# Patient Record
Sex: Male | Born: 1955 | Race: Black or African American | Hispanic: No | Marital: Married | Smoking: Former smoker
Health system: Southern US, Community
[De-identification: ages and names within clinical notes are randomized; demographics above are authoritative.]

## PROBLEM LIST (undated history)

## (undated) DIAGNOSIS — H538 Other visual disturbances: Secondary | ICD-10-CM

## (undated) DIAGNOSIS — F32A Depression, unspecified: Secondary | ICD-10-CM

## (undated) DIAGNOSIS — Z973 Presence of spectacles and contact lenses: Secondary | ICD-10-CM

## (undated) DIAGNOSIS — I10 Essential (primary) hypertension: Secondary | ICD-10-CM

## (undated) DIAGNOSIS — N400 Enlarged prostate without lower urinary tract symptoms: Secondary | ICD-10-CM

## (undated) DIAGNOSIS — R7303 Prediabetes: Secondary | ICD-10-CM

## (undated) DIAGNOSIS — K4090 Unilateral inguinal hernia, without obstruction or gangrene, not specified as recurrent: Secondary | ICD-10-CM

## (undated) DIAGNOSIS — S83207A Unspecified tear of unspecified meniscus, current injury, left knee, initial encounter: Secondary | ICD-10-CM

## (undated) DIAGNOSIS — C61 Malignant neoplasm of prostate: Secondary | ICD-10-CM

## (undated) DIAGNOSIS — M1990 Unspecified osteoarthritis, unspecified site: Secondary | ICD-10-CM

## (undated) DIAGNOSIS — R972 Elevated prostate specific antigen [PSA]: Secondary | ICD-10-CM

## (undated) DIAGNOSIS — K219 Gastro-esophageal reflux disease without esophagitis: Secondary | ICD-10-CM

## (undated) DIAGNOSIS — M549 Dorsalgia, unspecified: Secondary | ICD-10-CM

## (undated) DIAGNOSIS — E7800 Pure hypercholesterolemia, unspecified: Secondary | ICD-10-CM

## (undated) HISTORY — DX: Dorsalgia, unspecified: M54.9

## (undated) HISTORY — DX: Essential (primary) hypertension: I10

## (undated) HISTORY — DX: Other visual disturbances: H53.8

## (undated) HISTORY — DX: Unilateral inguinal hernia, without obstruction or gangrene, not specified as recurrent: K40.90

## (undated) HISTORY — PX: PROSTATE BIOPSY: SHX241

## (undated) HISTORY — PX: HERNIA REPAIR: SHX51

## (undated) HISTORY — PX: INGUINAL HERNIA REPAIR: SUR1180

---

## 1997-12-25 ENCOUNTER — Emergency Department (HOSPITAL_COMMUNITY): Admission: EM | Admit: 1997-12-25 | Discharge: 1997-12-25 | Payer: Self-pay | Admitting: Emergency Medicine

## 1998-01-06 ENCOUNTER — Emergency Department (HOSPITAL_COMMUNITY): Admission: EM | Admit: 1998-01-06 | Discharge: 1998-01-06 | Payer: Self-pay | Admitting: Emergency Medicine

## 1998-01-07 ENCOUNTER — Emergency Department (HOSPITAL_COMMUNITY): Admission: EM | Admit: 1998-01-07 | Discharge: 1998-01-07 | Payer: Self-pay | Admitting: Emergency Medicine

## 2000-01-26 ENCOUNTER — Encounter: Admission: RE | Admit: 2000-01-26 | Discharge: 2000-01-26 | Payer: Self-pay

## 2000-06-30 ENCOUNTER — Encounter: Payer: Self-pay | Admitting: Cardiology

## 2000-06-30 ENCOUNTER — Ambulatory Visit (HOSPITAL_COMMUNITY): Admission: RE | Admit: 2000-06-30 | Discharge: 2000-06-30 | Payer: Self-pay | Admitting: Cardiology

## 2000-12-26 ENCOUNTER — Emergency Department (HOSPITAL_COMMUNITY): Admission: EM | Admit: 2000-12-26 | Discharge: 2000-12-26 | Payer: Self-pay | Admitting: Emergency Medicine

## 2000-12-26 ENCOUNTER — Encounter: Payer: Self-pay | Admitting: Emergency Medicine

## 2002-05-10 ENCOUNTER — Emergency Department (HOSPITAL_COMMUNITY): Admission: EM | Admit: 2002-05-10 | Discharge: 2002-05-10 | Payer: Self-pay | Admitting: Emergency Medicine

## 2003-08-21 ENCOUNTER — Emergency Department (HOSPITAL_COMMUNITY): Admission: EM | Admit: 2003-08-21 | Discharge: 2003-08-21 | Payer: Self-pay | Admitting: Emergency Medicine

## 2003-09-19 ENCOUNTER — Encounter: Admission: RE | Admit: 2003-09-19 | Discharge: 2003-09-19 | Payer: Self-pay | Admitting: Cardiology

## 2004-05-02 ENCOUNTER — Emergency Department (HOSPITAL_COMMUNITY): Admission: EM | Admit: 2004-05-02 | Discharge: 2004-05-02 | Payer: Self-pay | Admitting: Emergency Medicine

## 2005-01-12 ENCOUNTER — Encounter: Admission: RE | Admit: 2005-01-12 | Discharge: 2005-01-12 | Payer: Self-pay | Admitting: Cardiology

## 2005-04-03 ENCOUNTER — Emergency Department (HOSPITAL_COMMUNITY): Admission: EM | Admit: 2005-04-03 | Discharge: 2005-04-03 | Payer: Self-pay | Admitting: Emergency Medicine

## 2008-12-02 ENCOUNTER — Emergency Department (HOSPITAL_COMMUNITY): Admission: EM | Admit: 2008-12-02 | Discharge: 2008-12-02 | Payer: Self-pay | Admitting: Emergency Medicine

## 2010-08-08 ENCOUNTER — Other Ambulatory Visit (HOSPITAL_COMMUNITY): Payer: Self-pay | Admitting: Surgery

## 2010-08-08 ENCOUNTER — Other Ambulatory Visit: Payer: Self-pay | Admitting: Surgery

## 2010-08-08 ENCOUNTER — Encounter (HOSPITAL_COMMUNITY): Payer: Managed Care, Other (non HMO)

## 2010-08-08 ENCOUNTER — Ambulatory Visit (HOSPITAL_COMMUNITY)
Admission: RE | Admit: 2010-08-08 | Discharge: 2010-08-08 | Disposition: A | Discharge status: Home / Self Care | Payer: Managed Care, Other (non HMO) | Source: Ambulatory Visit | Attending: Surgery | Admitting: Surgery

## 2010-08-08 DIAGNOSIS — K4090 Unilateral inguinal hernia, without obstruction or gangrene, not specified as recurrent: Secondary | ICD-10-CM | POA: Insufficient documentation

## 2010-08-08 DIAGNOSIS — Z01818 Encounter for other preprocedural examination: Secondary | ICD-10-CM | POA: Insufficient documentation

## 2010-08-08 DIAGNOSIS — Z87891 Personal history of nicotine dependence: Secondary | ICD-10-CM | POA: Insufficient documentation

## 2010-08-08 DIAGNOSIS — I10 Essential (primary) hypertension: Secondary | ICD-10-CM | POA: Insufficient documentation

## 2010-08-08 LAB — COMPREHENSIVE METABOLIC PANEL
ALT: 18 U/L (ref 0–53)
AST: 20 U/L (ref 0–37)
Albumin: 3.8 g/dL (ref 3.5–5.2)
Alkaline Phosphatase: 77 U/L (ref 39–117)
BUN: 19 mg/dL (ref 6–23)
Calcium: 9.8 mg/dL (ref 8.4–10.5)
Creatinine, Ser: 1.09 mg/dL (ref 0.4–1.5)
GFR calc Af Amer: >60 mL/min (ref >60)
Potassium: 4.2 mEq/L (ref 3.5–5.1)
Total Protein: 6.6 g/dL (ref 6.0–8.3)

## 2010-08-08 LAB — DIFFERENTIAL
Basophils Absolute: 0 10*3/uL (ref 0.0–0.1)
Lymphs Abs: 2.2 10*3/uL (ref 0.7–4.0)
Monocytes Relative: 8 % (ref 3–12)
Neutro Abs: 3 10*3/uL (ref 1.7–7.7)
Neutrophils Relative %: 52 % (ref 43–77)

## 2010-08-08 LAB — CBC
HCT: 42.6 % (ref 39.0–52.0)
Hemoglobin: 14.7 g/dL (ref 13.0–17.0)
MCH: 32.6 pg (ref 26.0–34.0)
MCHC: 34.5 g/dL (ref 30.0–36.0)
MCV: 94.5 fL (ref 78.0–100.0)

## 2010-08-08 LAB — SURGICAL PCR SCREEN
MRSA, PCR: INVALID — AB
Staphylococcus aureus: INVALID — AB

## 2010-08-12 ENCOUNTER — Ambulatory Visit (HOSPITAL_COMMUNITY)
Admission: RE | Admit: 2010-08-12 | Discharge: 2010-08-12 | Disposition: A | Discharge status: Home / Self Care | Payer: Managed Care, Other (non HMO) | Source: Ambulatory Visit | Attending: Surgery | Admitting: Surgery

## 2010-08-12 DIAGNOSIS — K4090 Unilateral inguinal hernia, without obstruction or gangrene, not specified as recurrent: Secondary | ICD-10-CM | POA: Insufficient documentation

## 2010-08-12 DIAGNOSIS — F17200 Nicotine dependence, unspecified, uncomplicated: Secondary | ICD-10-CM | POA: Insufficient documentation

## 2010-08-12 DIAGNOSIS — N400 Enlarged prostate without lower urinary tract symptoms: Secondary | ICD-10-CM | POA: Insufficient documentation

## 2010-08-12 DIAGNOSIS — I10 Essential (primary) hypertension: Secondary | ICD-10-CM | POA: Insufficient documentation

## 2010-08-12 DIAGNOSIS — Z79899 Other long term (current) drug therapy: Secondary | ICD-10-CM | POA: Insufficient documentation

## 2010-08-19 NOTE — Op Note (Signed)
NAME:  Daniel Norris, Daniel Norris               ACCOUNT NO.:  [id number]  MEDICAL RECORD NO.:  [id number]           PATIENT TYPE:  O  LOCATION:  DAYL                         FACILITY:  [facility]  PHYSICIAN:  Clovis Pu. , M.D.DATE OF BIRTH:  1955/10/28  DATE OF PROCEDURE:  08/12/2010 DATE OF DISCHARGE:  08/12/2010                              OPERATIVE REPORT   PREOPERATIVE DIAGNOSIS:  Symptomatic right inguinal hernia.  POSTOPERATIVE DIAGNOSIS:  Symptomatic right inguinal hernia.  PROCEDURE:  Repair of right inguinal hernia with Ultrapro mesh.  SURGEON:  Maisie Fus A. , M.D.  ANESTHESIA:  General endotracheal anesthesia with 0.25% Sensorcaine local.  ESTIMATED BLOOD LOSS:  Minimal.  SPECIMEN:  None.  INDICATIONS FOR PROCEDURE:  The patient is a 55 year old male with a symptomatic right inguinal hernia.  We discussed options of open repair, observation, versus laparoscopic repair.  After all these discussions, he wished to proceed with open repair.  Risk of bleeding, infection, chronic pain, stiffness, numbness, testicular discomfort, organ injury to neighboring structures were all discussed in the office.  He agreed to proceed.  DESCRIPTION OF PROCEDURE:  The patient was brought to the operating room.  After induction of general anesthesia, time-out was done and the abdomen was prepped and draped in a sterile fashion.  A right inguinal crease incision was made after infiltration in the skin with 0.25% Sensorcaine local.  Dissection was carried down through the subcu fat. Scarpa fascia was opened.  The superficial epigastric vessels were ligated with 3-0 Vicryl.  The aponeurosis external oblique was visualized, injected with 0.25% Sensorcaine with epinephrine, and opened in the direction of its fibers through the external ring.  Cord structures were encircled with 0.25-inch Penrose drain.  Cord was examined and there was a small indirect sac and dissected off and placed back in  the preperitoneal space.  The floor felt to be intact.  A large Ultrapro hernia system was used.  The inner leaflet was placed in the preperitoneal space along the cord and deployed with my finger.  Onlay was then placed in the floor of the inguinal canal.  I secured the mesh, the pubic tubercle with 0 Vicryl and the connection between the onlay and the inlay was sutured to the internal oblique with #0 Vicryl.  A #1 Novafil pop offs were used to secure the mesh circumferentially to the shelving edge of the ligament and to the internal oblique.  A large ilioinguinal nerve was identified and preserved.  Sutures were placed well away from this.  The inlay laid nice and flat.  We closed the mesh around the cord structures with 0 Novafil.  There was ample room to get my finger to be inserted without difficulty.  We elected to preserve the ilioinguinal nerve in this setting.  We then closed the aponeurosis external oblique with 2-0 Vicryl.  A 3-0 Vicryl was used to approximate Scarpa fascia and 4-0 Monocryl was used to close the skin in a subcuticular fashion.  Dermabond was applied.  All final counts of sponge, needle, and instruments were found to be correct for this portion of the case.  Patient was awaken and  was taken to the Recovery in satisfactory condition.      A. , M.D.     TAC/MEDQ  D:  08/12/2010  T:  08/13/2010  Job:  [id number]  cc:   Osvaldo Shipper. Spruill, M.D. Fax: [phone]  Electronically Signed by Harriette Bouillon M.D. on 08/19/2010 08:45:31 AM

## 2011-03-18 ENCOUNTER — Ambulatory Visit
Admission: RE | Admit: 2011-03-18 | Discharge: 2011-03-18 | Disposition: A | Discharge status: Home / Self Care | Payer: Managed Care, Other (non HMO) | Source: Ambulatory Visit | Attending: Surgery | Admitting: Surgery

## 2011-03-18 ENCOUNTER — Ambulatory Visit (INDEPENDENT_AMBULATORY_CARE_PROVIDER_SITE_OTHER): Payer: Managed Care, Other (non HMO) | Admitting: Surgery

## 2011-03-18 ENCOUNTER — Encounter (INDEPENDENT_AMBULATORY_CARE_PROVIDER_SITE_OTHER): Payer: Self-pay | Admitting: Surgery

## 2011-03-18 VITALS — BP 120/78 | HR 68 | Temp 97.2°F | Resp 16 | Ht 71.5 in | Wt 178.0 lb

## 2011-03-18 DIAGNOSIS — R1030 Lower abdominal pain, unspecified: Secondary | ICD-10-CM

## 2011-03-18 DIAGNOSIS — R109 Unspecified abdominal pain: Secondary | ICD-10-CM

## 2011-03-18 NOTE — Patient Instructions (Signed)
Will check CXR.  If normal will need referral to spine doctor.  Quit smoking.  NO evidence of recurrent hernia.
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
## 2011-03-19 ENCOUNTER — Other Ambulatory Visit (INDEPENDENT_AMBULATORY_CARE_PROVIDER_SITE_OTHER): Payer: Self-pay | Admitting: General Surgery

## 2011-03-19 DIAGNOSIS — M549 Dorsalgia, unspecified: Secondary | ICD-10-CM

## 2011-12-23 ENCOUNTER — Emergency Department (HOSPITAL_COMMUNITY): Payer: Managed Care, Other (non HMO)

## 2011-12-23 ENCOUNTER — Encounter (HOSPITAL_COMMUNITY): Payer: Self-pay | Admitting: Emergency Medicine

## 2011-12-23 ENCOUNTER — Emergency Department (HOSPITAL_COMMUNITY)
Admission: EM | Admit: 2011-12-23 | Discharge: 2011-12-23 | Disposition: A | Discharge status: Home / Self Care | Payer: Managed Care, Other (non HMO) | Attending: Emergency Medicine | Admitting: Emergency Medicine

## 2011-12-23 DIAGNOSIS — M5380 Other specified dorsopathies, site unspecified: Secondary | ICD-10-CM | POA: Insufficient documentation

## 2011-12-23 DIAGNOSIS — M545 Low back pain, unspecified: Secondary | ICD-10-CM | POA: Insufficient documentation

## 2011-12-23 DIAGNOSIS — M25569 Pain in unspecified knee: Secondary | ICD-10-CM | POA: Insufficient documentation

## 2011-12-23 DIAGNOSIS — I10 Essential (primary) hypertension: Secondary | ICD-10-CM | POA: Insufficient documentation

## 2011-12-23 DIAGNOSIS — S39012A Strain of muscle, fascia and tendon of lower back, initial encounter: Secondary | ICD-10-CM

## 2011-12-23 DIAGNOSIS — S335XXA Sprain of ligaments of lumbar spine, initial encounter: Secondary | ICD-10-CM | POA: Insufficient documentation

## 2011-12-23 DIAGNOSIS — F17200 Nicotine dependence, unspecified, uncomplicated: Secondary | ICD-10-CM | POA: Insufficient documentation

## 2011-12-23 DIAGNOSIS — Z79899 Other long term (current) drug therapy: Secondary | ICD-10-CM | POA: Insufficient documentation

## 2011-12-23 DIAGNOSIS — M25562 Pain in left knee: Secondary | ICD-10-CM

## 2011-12-23 MED ORDER — DIAZEPAM 5 MG PO TABS
5.0000 mg | ORAL_TABLET | Freq: Once | ORAL | Status: AC
  Administered 2011-12-23: 5 mg via ORAL
  Filled 2011-12-23: qty 1

## 2011-12-23 MED ORDER — DIAZEPAM 5 MG PO TABS: 5.0000 mg | ORAL_TABLET | Freq: Four times a day (QID) | ORAL | Status: AC | PRN

## 2011-12-23 MED ORDER — HYDROCODONE-ACETAMINOPHEN 5-325 MG PO TABS
1.0000 | ORAL_TABLET | Freq: Once | ORAL | Status: AC
  Administered 2011-12-23: 1 via ORAL
  Filled 2011-12-23: qty 1

## 2011-12-23 MED ORDER — HYDROCODONE-ACETAMINOPHEN 5-325 MG PO TABS: 1.0000 | ORAL_TABLET | ORAL | Status: AC | PRN

## 2011-12-23 NOTE — ED Provider Notes (Signed)
Medical screening examination/treatment/procedure(s) were performed by non-physician practitioner and as supervising physician I was immediately available for consultation/collaboration.    Vida Roller, MD 12/23/11 2350
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
## 2011-12-27 ENCOUNTER — Emergency Department (HOSPITAL_COMMUNITY): Payer: Managed Care, Other (non HMO)

## 2011-12-27 ENCOUNTER — Encounter (HOSPITAL_COMMUNITY): Payer: Self-pay

## 2011-12-27 ENCOUNTER — Emergency Department (HOSPITAL_COMMUNITY)
Admission: EM | Admit: 2011-12-27 | Discharge: 2011-12-27 | Disposition: A | Discharge status: Home / Self Care | Payer: Managed Care, Other (non HMO) | Attending: Emergency Medicine | Admitting: Emergency Medicine

## 2011-12-27 DIAGNOSIS — M25539 Pain in unspecified wrist: Secondary | ICD-10-CM | POA: Insufficient documentation

## 2011-12-27 DIAGNOSIS — M549 Dorsalgia, unspecified: Secondary | ICD-10-CM | POA: Insufficient documentation

## 2011-12-27 DIAGNOSIS — M542 Cervicalgia: Secondary | ICD-10-CM

## 2011-12-27 DIAGNOSIS — M79673 Pain in unspecified foot: Secondary | ICD-10-CM

## 2011-12-27 DIAGNOSIS — I10 Essential (primary) hypertension: Secondary | ICD-10-CM | POA: Insufficient documentation

## 2011-12-27 DIAGNOSIS — M79609 Pain in unspecified limb: Secondary | ICD-10-CM | POA: Insufficient documentation

## 2011-12-27 DIAGNOSIS — F17200 Nicotine dependence, unspecified, uncomplicated: Secondary | ICD-10-CM | POA: Insufficient documentation
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
## 2011-12-28 NOTE — ED Provider Notes (Signed)
Medical screening examination/treatment/procedure(s) were performed by non-physician practitioner and as supervising physician I was immediately available for consultation/collaboration.  Ethelda Chick, MD 12/28/11 [phone]

## 2012-02-26 ENCOUNTER — Other Ambulatory Visit: Payer: Self-pay | Admitting: Family Medicine

## 2012-02-26 DIAGNOSIS — M542 Cervicalgia: Secondary | ICD-10-CM

## 2012-03-02 ENCOUNTER — Inpatient Hospital Stay: Admission: RE | Admit: 2012-03-02 | Payer: Managed Care, Other (non HMO) | Source: Ambulatory Visit

## 2012-04-13 ENCOUNTER — Ambulatory Visit
Admission: RE | Admit: 2012-04-13 | Discharge: 2012-04-13 | Disposition: A | Discharge status: Home / Self Care | Payer: No Typology Code available for payment source | Source: Ambulatory Visit | Attending: Family Medicine | Admitting: Family Medicine

## 2012-04-13 DIAGNOSIS — M542 Cervicalgia: Secondary | ICD-10-CM

## 2012-06-22 DIAGNOSIS — F419 Anxiety disorder, unspecified: Secondary | ICD-10-CM

## 2012-06-22 HISTORY — DX: Anxiety disorder, unspecified: F41.9

## 2012-12-01 ENCOUNTER — Ambulatory Visit (INDEPENDENT_AMBULATORY_CARE_PROVIDER_SITE_OTHER): Payer: BC Managed Care – PPO | Admitting: Emergency Medicine

## 2012-12-01 VITALS — BP 136/80 | HR 58 | Temp 97.6°F | Resp 18 | Ht 71.0 in | Wt 183.0 lb

## 2012-12-01 DIAGNOSIS — N3289 Other specified disorders of bladder: Secondary | ICD-10-CM

## 2012-12-01 DIAGNOSIS — R3989 Other symptoms and signs involving the genitourinary system: Secondary | ICD-10-CM

## 2012-12-01 DIAGNOSIS — R300 Dysuria: Secondary | ICD-10-CM

## 2012-12-01 LAB — POCT CBC
Granulocyte percent: 63.2 %G (ref 37–80)
HCT, POC: 46.5 % (ref 43.5–53.7)
MCHC: 32.7 g/dL (ref 31.8–35.4)
MCV: 102.6 fL — AB (ref 80–97)
MID (cbc): 0.6 (ref 0–0.9)
MPV: 8.7 fL (ref 0–99.8)
POC Granulocyte: 3.7 (ref 2–6.9)
POC MID %: 10.3 %M (ref 0–12)
Platelet Count, POC: 184 10*3/uL (ref 142–424)
RBC: 4.53 M/uL — AB (ref 4.69–6.13)
RDW, POC: 13.8 %
WBC: 5.8 10*3/uL (ref 4.6–10.2)

## 2012-12-01 LAB — POCT URINALYSIS DIPSTICK
Bilirubin, UA: NEGATIVE
Leukocytes, UA: NEGATIVE
Nitrite, UA: NEGATIVE
pH, UA: 5

## 2012-12-01 LAB — POCT UA - MICROSCOPIC ONLY
Casts, Ur, LPF, POC: NEGATIVE
Crystals, Ur, HPF, POC: NEGATIVE
Mucus, UA: NEGATIVE
Yeast, UA: NEGATIVE

## 2012-12-01 MED ORDER — TAMSULOSIN HCL 0.4 MG PO CAPS: 0.4000 mg | ORAL_CAPSULE | Freq: Every day | ORAL | Status: DC

## 2012-12-01 NOTE — Progress Notes (Signed)
  Subjective:    Patient ID: Daniel Norris, male    DOB: 1956-01-16, 57 y.o.   MRN: [id number]  HPI Patient is having difficulty urinating, straining, painful, pressure x 5 days; has to get up at night to urinate 5-6 times Father had prostate cancer.  Primary doctor placed patient on cipro for bladder infection.    Review of Systems     Objective:   Physical Exam abdomen is soft and nontender. GU exam reveals a normal male no hernias are felt. Rectal exam reveals an enlarged prostate which is slightly tender.  Results for orders placed in visit on 12/01/12  POCT CBC      Result Value Range   WBC 5.8  4.6 - 10.2 K/uL   Lymph, poc 1.5  0.6 - 3.4   POC LYMPH PERCENT 26.5  10 - 50 %L   MID (cbc) 0.6  0 - 0.9   POC MID % 10.3  0 - 12 %M   POC Granulocyte 3.7  2 - 6.9   Granulocyte percent 63.2  37 - 80 %G   RBC 4.53 (*) 4.69 - 6.13 M/uL   Hemoglobin 15.2  14.1 - 18.1 g/dL   HCT, POC 40.9  81.1 - 53.7 %   MCV 102.6 (*) 80 - 97 fL   MCH, POC 33.6 (*) 27 - 31.2 pg   MCHC 32.7  31.8 - 35.4 g/dL   RDW, POC 91.4     Platelet Count, POC 184  142 - 424 K/uL   MPV 8.7  0 - 99.8 fL  POCT URINALYSIS DIPSTICK      Result Value Range   Color, UA yellow     Clarity, UA clear     Glucose, UA neg     Bilirubin, UA neg     Ketones, UA neg     Spec Grav, UA >=1.030     Blood, UA small     pH, UA 5.0     Protein, UA neg     Urobilinogen, UA 0.2     Nitrite, UA neg     Leukocytes, UA Negative    POCT UA - MICROSCOPIC ONLY      Result Value Range   WBC, Ur, HPF, POC 1-2     RBC, urine, microscopic 0-2     Bacteria, U Microscopic neg     Mucus, UA neg     Epithelial cells, urine per micros 0-1     Crystals, Ur, HPF, POC neg     Casts, Ur, LPF, POC neg     Yeast, UA neg    IFOBT (OCCULT BLOOD)      Result Value Range   IFOBT Negative          Assessment & Plan:  Patient is having signs and symptoms of an urinary obstruction. We'll treat with Flomax to make urological  referral.

## 2012-12-02 LAB — URINE CULTURE: Organism ID, Bacteria: NO GROWTH

## 2012-12-09 NOTE — Addendum Note (Signed)
Addended by: Johnnette Litter on: 12/09/2012 10:53 AM   Modules accepted: Orders
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
## 2015-09-24 ENCOUNTER — Ambulatory Visit: Payer: Self-pay | Admitting: Surgery

## 2015-09-25 ENCOUNTER — Other Ambulatory Visit: Payer: Self-pay | Admitting: Surgery

## 2015-09-25 DIAGNOSIS — R1031 Right lower quadrant pain: Secondary | ICD-10-CM

## 2015-09-26 ENCOUNTER — Other Ambulatory Visit: Payer: Self-pay

## 2016-06-09 ENCOUNTER — Emergency Department (HOSPITAL_COMMUNITY)
Admission: EM | Admit: 2016-06-09 | Discharge: 2016-06-09 | Disposition: A | Discharge status: Home / Self Care | Payer: Medicare Other | Attending: Emergency Medicine | Admitting: Emergency Medicine

## 2016-06-09 ENCOUNTER — Encounter (HOSPITAL_COMMUNITY): Payer: Self-pay | Admitting: Emergency Medicine

## 2016-06-09 DIAGNOSIS — R51 Headache: Secondary | ICD-10-CM | POA: Insufficient documentation

## 2016-06-09 DIAGNOSIS — F17200 Nicotine dependence, unspecified, uncomplicated: Secondary | ICD-10-CM | POA: Insufficient documentation

## 2016-06-09 DIAGNOSIS — I10 Essential (primary) hypertension: Secondary | ICD-10-CM | POA: Insufficient documentation

## 2016-06-09 DIAGNOSIS — R0789 Other chest pain: Secondary | ICD-10-CM | POA: Insufficient documentation

## 2016-06-09 DIAGNOSIS — R519 Headache, unspecified: Secondary | ICD-10-CM

## 2016-06-09 DIAGNOSIS — Z79899 Other long term (current) drug therapy: Secondary | ICD-10-CM | POA: Insufficient documentation

## 2016-06-09 DIAGNOSIS — B353 Tinea pedis: Secondary | ICD-10-CM | POA: Insufficient documentation

## 2016-06-09 LAB — COMPREHENSIVE METABOLIC PANEL
ALK PHOS: 69 U/L (ref 38–126)
ALT: 20 U/L (ref 17–63)
ANION GAP: 7 (ref 5–15)
AST: 22 U/L (ref 15–41)
Albumin: 3.8 g/dL (ref 3.5–5.0)
BILIRUBIN TOTAL: 1 mg/dL (ref 0.3–1.2)
BUN: 15 mg/dL (ref 6–20)
CALCIUM: 9.3 mg/dL (ref 8.9–10.3)
CO2: 24 mmol/L (ref 22–32)
CREATININE: 1.11 mg/dL (ref 0.61–1.24)
Chloride: 109 mmol/L (ref 101–111)
GFR calc non Af Amer: >60 mL/min (ref >60)
GLUCOSE: 153 mg/dL — AB (ref 65–99)
Potassium: 3.8 mmol/L (ref 3.5–5.1)
SODIUM: 140 mmol/L (ref 135–145)
TOTAL PROTEIN: 6.5 g/dL (ref 6.5–8.1)

## 2016-06-09 LAB — URINALYSIS, ROUTINE W REFLEX MICROSCOPIC
Bacteria, UA: NONE SEEN
Bilirubin Urine: NEGATIVE
GLUCOSE, UA: NEGATIVE mg/dL
Ketones, ur: NEGATIVE mg/dL
Leukocytes, UA: NEGATIVE
NITRITE: NEGATIVE
PH: 5 (ref 5.0–8.0)
Protein, ur: NEGATIVE mg/dL
SPECIFIC GRAVITY, URINE: 1.013 (ref 1.005–1.030)
Squamous Epithelial / LPF: NONE SEEN

## 2016-06-09 LAB — CBC
HCT: 38.4 % — ABNORMAL LOW (ref 39.0–52.0)
Hemoglobin: 13.8 g/dL (ref 13.0–17.0)
MCH: 32.5 pg (ref 26.0–34.0)
MCHC: 35.9 g/dL (ref 30.0–36.0)
MCV: 90.6 fL (ref 78.0–100.0)
PLATELETS: 155 10*3/uL (ref 150–400)
RBC: 4.24 MIL/uL (ref 4.22–5.81)
RDW: 12.2 % (ref 11.5–15.5)
WBC: 6.2 10*3/uL (ref 4.0–10.5)

## 2016-06-09 MED ORDER — NAPROXEN 375 MG PO TABS: 375.0000 mg | ORAL_TABLET | Freq: Two times a day (BID) | ORAL | 0 refills | Status: AC | PRN

## 2016-06-09 MED ORDER — KETOCONAZOLE 2 % EX CREA: 1.0000 "application " | TOPICAL_CREAM | Freq: Two times a day (BID) | CUTANEOUS | 0 refills | Status: AC

## 2016-06-09 MED ORDER — CYCLOBENZAPRINE HCL 10 MG PO TABS: 10.0000 mg | ORAL_TABLET | Freq: Three times a day (TID) | ORAL | 0 refills | Status: DC | PRN

## 2016-06-09 MED ORDER — FLUCONAZOLE 150 MG PO TABS: 150.0000 mg | ORAL_TABLET | Freq: Once | ORAL | 0 refills | Status: AC

## 2016-06-09 NOTE — ED Provider Notes (Addendum)
[facility]-EMERGENCY DEPT Provider Note   CSN: [id number] Arrival date & time: 06/09/16  1601     History   Chief Complaint Chief Complaint  Patient presents with  . Foot Pain  . Headache    HPI Daniel Norris is a 60 y.o. male.  HPI   60 yo M with PMHx as below here with multiple complaints.  Primary complaint is "blackness" of right fourth toe. Pt has reportedly had a one month history of mild itching, irritation, and darkness to his fourth toe. He noticed it while showering. Since then he has tried "cleaning" it frequently and it occasionally bleeds. He has no pain with it. He noticed it was getting darker over past week so he decided to present to the ED.  Otherwise, he has chronic, sharp, positional CP that is not unchanged and has been evaluated extensively. It is present only when he moves or presses on his chest wall. No LOC. No h/o DVT or PE. No history of CAD.  Pt also reports multiple other complaints including blurred vision, chronic lower back pain, that have all been present for "years" and not acutely worsened.  Past Medical History:  Diagnosis Date  . Back pain   . Blurred vision   . Hypertension   . Right groin hernia     Patient Active Problem List   Diagnosis Date Noted  . 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 HERNIA REPAIR         Home Medications    Prior to Admission medications   Medication Sig Start Date End Date Taking? Authorizing Provider  ciprofloxacin (CIPRO) 500 MG tablet Take 500 mg by mouth 2 (two) times daily.    Historical Provider, MD  cyclobenzaprine (FLEXERIL) 10 MG tablet Take 1 tablet (10 mg total) by mouth 3 (three) times daily as needed for muscle spasms. 06/09/16   Shaune Pollackameron , MD  fluconazole (DIFLUCAN) 150 MG tablet Take 1 tablet (150 mg total) by mouth once. 06/09/16 06/09/16  Shaune Pollackameron , MD  ibuprofen (ADVIL,MOTRIN) 200 MG tablet Take 200 mg by mouth every 6 (six) hours  as needed. Pain    Historical Provider, MD  ketoconazole (NIZORAL) 2 % cream Apply 1 application topically 2 (two) times daily. 06/09/16 06/23/16  Shaune Pollackameron , MD  naproxen (NAPROSYN) 375 MG tablet Take 1 tablet (375 mg total) by mouth 2 (two) times daily as needed for moderate pain. 06/09/16 06/16/16  Shaune Pollackameron , MD  olmesartan-hydrochlorothiazide (BENICAR HCT) 20-12.5 MG per tablet Take 1 tablet by mouth daily.      Historical Provider, MD  tamsulosin (FLOMAX) 0.4 MG CAPS Take 1 capsule (0.4 mg total) by mouth daily. 12/01/12   Collene GobbleSteven A Daub, MD    Family History Family History  Problem Relation Age of Onset  . Kidney disease Mother     kidney failure     Social History Social History  Substance Use Topics  . Smoking status: Current Every Day Smoker    Packs/day: 0.50  . Smokeless tobacco: Never Used  . Alcohol use Yes     Allergies   Patient has no known allergies.   Review of Systems Review of Systems  Constitutional: Positive for fatigue. Negative for chills and fever.  HENT: Positive for congestion. Negative for rhinorrhea.   Eyes: Positive for visual disturbance.  Respiratory: Negative for cough, shortness of breath and wheezing.   Cardiovascular: Negative for chest pain and leg swelling.  Gastrointestinal: Negative for  abdominal pain, diarrhea, nausea and vomiting.  Genitourinary: Negative for dysuria and flank pain.  Musculoskeletal: Positive for back pain. Negative for neck pain and neck stiffness.  Skin: Positive for rash. Negative for wound.  Allergic/Immunologic: Negative for immunocompromised state.  Neurological: Positive for headaches. Negative for syncope and weakness.  All other systems reviewed and are negative.    Physical Exam Updated Vital Signs BP 133/93 (BP Location: Left Arm)   Pulse 76   Temp 97.8 F (36.6 C) (Oral)   Resp 18   SpO2 99%   Physical Exam  Constitutional: He is oriented to person, place, and time. He appears  well-developed and well-nourished. No distress.  HENT:  Head: Normocephalic and atraumatic.  Mouth/Throat: Oropharynx is clear and moist.  Eyes: Conjunctivae are normal.  Neck: Neck supple.  Cardiovascular: Normal rate, regular rhythm and normal heart sounds.  Exam reveals no friction rub.   No murmur heard. Pulmonary/Chest: Effort normal and breath sounds normal. No respiratory distress. He has no wheezes. He has no rales.  Abdominal: He exhibits no distension.  Musculoskeletal: He exhibits no edema.  Neurological: He is alert and oriented to person, place, and time. He has normal strength. No cranial nerve deficit or sensory deficit. He exhibits normal muscle tone. Gait normal.  Skin: Skin is warm. Capillary refill takes less than 2 seconds.  Hyperkeratotic, scaling rash in fourth to fifth interdigital space. Secondary scaling. No open wounds or induration. No fluctuance or drainage.  Psychiatric: He has a normal mood and affect.  Nursing note and vitals reviewed.    ED Treatments / Results  Labs (all labs ordered are listed, but only abnormal results are displayed) Labs Reviewed  COMPREHENSIVE METABOLIC PANEL - Abnormal; Notable for the following:       Result Value   Glucose, Bld 153 (*)    All other components within normal limits  CBC - Abnormal; Notable for the following:    HCT 38.4 (*)    All other components within normal limits  URINALYSIS, ROUTINE W REFLEX MICROSCOPIC - Abnormal; Notable for the following:    Hgb urine dipstick SMALL (*)    All other components within normal limits    EKG  EKG Interpretation  Date/Time:  Tuesday June 09 2016 19:00:05 EST Ventricular Rate:  56 PR Interval:  198 QRS Duration: 88 QT Interval:  418 QTC Calculation: 403 R Axis:   61 Text Interpretation:  Sinus bradycardia Cannot rule out Anterior infarct , age undetermined Abnormal ECG No significant change since last tracing Confirmed by  MD, Sheria LangAMERON [phone]([id number]) on  06/09/2016 7:06:02 PM       Radiology No results found.  Procedures Procedures (including critical care time)  Medications Ordered in ED Medications - No data to display   Initial Impression / Assessment and Plan / ED Course  I have reviewed the triage vital signs and the nursing notes.  Pertinent labs & imaging results that were available during my care of the patient were reviewed by me and considered in my medical decision making (see chart for details).  Clinical Course     60 yo M with PMHx as above here with multiple chronic complaints. On arrival, VSS and WNL. Exam as above.  1.) Toe rash: Consistent with tinea pedis. Pt has hyperkeratotic, hyperpigmented, delineated rash in interdigital space with secondary scaling. No surrounding erythema, induration, drainage, or evidence of infection. Will treat with tropical azole, outpt f/u.  2.) Chest pain: Chronic, Pt declines any h/o CAD. Declines  CXR as he ahs had his sx constantly for months with prior neg CXR. Doubt PNA, PE, or PTX. EKG obtained and is negative. It is pinpoint, positional, and likely MSK. Will treat with NSAIDs, flexeril.  3.) Blurred vision: Chronic, likely 2/2 aging. Eye exam unremarkable. F/u with Ophtho.  4.) Headache: Also chronic. No red flags. No acute onset. Neuro exam is non-focal. Likely 2/2 vision and will need f/u as above. Doubt CVA or mass lesion.   Final Clinical Impressions(s) / ED Diagnoses   Final diagnoses:  Tinea pedis of right foot  Nonintractable headache, unspecified chronicity pattern, unspecified headache type  Chest wall pain    New Prescriptions New Prescriptions   CYCLOBENZAPRINE (FLEXERIL) 10 MG TABLET    Take 1 tablet (10 mg total) by mouth 3 (three) times daily as needed for muscle spasms.   FLUCONAZOLE (DIFLUCAN) 150 MG TABLET    Take 1 tablet (150 mg total) by mouth once.   KETOCONAZOLE (NIZORAL) 2 % CREAM    Apply 1 application topically 2 (two) times daily.   NAPROXEN  (NAPROSYN) 375 MG TABLET    Take 1 tablet (375 mg total) by mouth 2 (two) times daily as needed for moderate pain.     Shaune Pollack, MD 06/09/16 1859    Shaune Pollack, MD 06/09/16 [id number]

## 2016-06-09 NOTE — ED Triage Notes (Signed)
Pt sts right foot pain and darkening of toes x 6 weeks; pt sts HA x 1 month; pt sts chronic issues but unable to see PCP

## 2016-07-22 DIAGNOSIS — R350 Frequency of micturition: Secondary | ICD-10-CM | POA: Diagnosis not present

## 2016-07-22 DIAGNOSIS — F39 Unspecified mood [affective] disorder: Secondary | ICD-10-CM | POA: Diagnosis not present

## 2016-07-22 DIAGNOSIS — E782 Mixed hyperlipidemia: Secondary | ICD-10-CM | POA: Diagnosis not present

## 2016-07-22 DIAGNOSIS — R7301 Impaired fasting glucose: Secondary | ICD-10-CM | POA: Diagnosis not present

## 2016-07-22 DIAGNOSIS — K219 Gastro-esophageal reflux disease without esophagitis: Secondary | ICD-10-CM | POA: Diagnosis not present

## 2016-07-22 DIAGNOSIS — Z87828 Personal history of other (healed) physical injury and trauma: Secondary | ICD-10-CM | POA: Diagnosis not present

## 2016-07-22 DIAGNOSIS — I10 Essential (primary) hypertension: Secondary | ICD-10-CM | POA: Diagnosis not present

## 2016-07-22 DIAGNOSIS — N401 Enlarged prostate with lower urinary tract symptoms: Secondary | ICD-10-CM | POA: Diagnosis not present

## 2016-08-04 DIAGNOSIS — F333 Major depressive disorder, recurrent, severe with psychotic symptoms: Secondary | ICD-10-CM | POA: Diagnosis not present

## 2016-08-14 DIAGNOSIS — N390 Urinary tract infection, site not specified: Secondary | ICD-10-CM | POA: Diagnosis not present

## 2016-08-14 DIAGNOSIS — R300 Dysuria: Secondary | ICD-10-CM | POA: Diagnosis not present

## 2016-08-14 DIAGNOSIS — R338 Other retention of urine: Secondary | ICD-10-CM | POA: Diagnosis not present

## 2016-08-14 DIAGNOSIS — N401 Enlarged prostate with lower urinary tract symptoms: Secondary | ICD-10-CM | POA: Diagnosis not present

## 2016-08-20 DIAGNOSIS — F4310 Post-traumatic stress disorder, unspecified: Secondary | ICD-10-CM | POA: Diagnosis not present

## 2016-08-20 DIAGNOSIS — Z634 Disappearance and death of family member: Secondary | ICD-10-CM | POA: Diagnosis not present

## 2016-08-24 DIAGNOSIS — Z634 Disappearance and death of family member: Secondary | ICD-10-CM | POA: Diagnosis not present

## 2016-08-24 DIAGNOSIS — F4310 Post-traumatic stress disorder, unspecified: Secondary | ICD-10-CM | POA: Diagnosis not present

## 2016-08-28 DIAGNOSIS — Z634 Disappearance and death of family member: Secondary | ICD-10-CM | POA: Diagnosis not present

## 2016-08-28 DIAGNOSIS — F4310 Post-traumatic stress disorder, unspecified: Secondary | ICD-10-CM | POA: Diagnosis not present

## 2016-09-03 DIAGNOSIS — Z634 Disappearance and death of family member: Secondary | ICD-10-CM | POA: Diagnosis not present

## 2016-09-03 DIAGNOSIS — F4310 Post-traumatic stress disorder, unspecified: Secondary | ICD-10-CM | POA: Diagnosis not present

## 2016-09-11 DIAGNOSIS — F4310 Post-traumatic stress disorder, unspecified: Secondary | ICD-10-CM | POA: Diagnosis not present

## 2016-09-11 DIAGNOSIS — Z634 Disappearance and death of family member: Secondary | ICD-10-CM | POA: Diagnosis not present

## 2016-09-26 DIAGNOSIS — Z634 Disappearance and death of family member: Secondary | ICD-10-CM | POA: Diagnosis not present

## 2016-09-26 DIAGNOSIS — F4310 Post-traumatic stress disorder, unspecified: Secondary | ICD-10-CM | POA: Diagnosis not present

## 2016-09-29 DIAGNOSIS — Z634 Disappearance and death of family member: Secondary | ICD-10-CM | POA: Diagnosis not present

## 2016-09-29 DIAGNOSIS — F4310 Post-traumatic stress disorder, unspecified: Secondary | ICD-10-CM | POA: Diagnosis not present

## 2016-10-06 DIAGNOSIS — F4310 Post-traumatic stress disorder, unspecified: Secondary | ICD-10-CM | POA: Diagnosis not present

## 2016-10-06 DIAGNOSIS — Z634 Disappearance and death of family member: Secondary | ICD-10-CM | POA: Diagnosis not present

## 2016-10-13 DIAGNOSIS — Z634 Disappearance and death of family member: Secondary | ICD-10-CM | POA: Diagnosis not present

## 2016-10-13 DIAGNOSIS — F4310 Post-traumatic stress disorder, unspecified: Secondary | ICD-10-CM | POA: Diagnosis not present

## 2016-10-14 DIAGNOSIS — M9903 Segmental and somatic dysfunction of lumbar region: Secondary | ICD-10-CM | POA: Diagnosis not present

## 2016-10-14 DIAGNOSIS — M9902 Segmental and somatic dysfunction of thoracic region: Secondary | ICD-10-CM | POA: Diagnosis not present

## 2016-10-14 DIAGNOSIS — M5134 Other intervertebral disc degeneration, thoracic region: Secondary | ICD-10-CM | POA: Diagnosis not present

## 2016-10-14 DIAGNOSIS — M5136 Other intervertebral disc degeneration, lumbar region: Secondary | ICD-10-CM | POA: Diagnosis not present

## 2016-10-16 DIAGNOSIS — M5134 Other intervertebral disc degeneration, thoracic region: Secondary | ICD-10-CM | POA: Diagnosis not present

## 2016-10-16 DIAGNOSIS — M9902 Segmental and somatic dysfunction of thoracic region: Secondary | ICD-10-CM | POA: Diagnosis not present

## 2016-10-16 DIAGNOSIS — M5136 Other intervertebral disc degeneration, lumbar region: Secondary | ICD-10-CM | POA: Diagnosis not present

## 2016-10-16 DIAGNOSIS — M9903 Segmental and somatic dysfunction of lumbar region: Secondary | ICD-10-CM | POA: Diagnosis not present

## 2016-10-27 DIAGNOSIS — F4310 Post-traumatic stress disorder, unspecified: Secondary | ICD-10-CM | POA: Diagnosis not present

## 2016-10-27 DIAGNOSIS — Z634 Disappearance and death of family member: Secondary | ICD-10-CM | POA: Diagnosis not present

## 2016-11-06 DIAGNOSIS — F4310 Post-traumatic stress disorder, unspecified: Secondary | ICD-10-CM | POA: Diagnosis not present

## 2016-11-06 DIAGNOSIS — Z634 Disappearance and death of family member: Secondary | ICD-10-CM | POA: Diagnosis not present

## 2016-11-10 DIAGNOSIS — F4310 Post-traumatic stress disorder, unspecified: Secondary | ICD-10-CM | POA: Diagnosis not present

## 2016-11-10 DIAGNOSIS — Z634 Disappearance and death of family member: Secondary | ICD-10-CM | POA: Diagnosis not present

## 2016-11-12 DIAGNOSIS — M5136 Other intervertebral disc degeneration, lumbar region: Secondary | ICD-10-CM | POA: Diagnosis not present

## 2016-11-12 DIAGNOSIS — M9902 Segmental and somatic dysfunction of thoracic region: Secondary | ICD-10-CM | POA: Diagnosis not present

## 2016-11-12 DIAGNOSIS — M5134 Other intervertebral disc degeneration, thoracic region: Secondary | ICD-10-CM | POA: Diagnosis not present

## 2016-11-12 DIAGNOSIS — M9903 Segmental and somatic dysfunction of lumbar region: Secondary | ICD-10-CM | POA: Diagnosis not present

## 2016-11-17 DIAGNOSIS — F4310 Post-traumatic stress disorder, unspecified: Secondary | ICD-10-CM | POA: Diagnosis not present

## 2016-11-17 DIAGNOSIS — M5134 Other intervertebral disc degeneration, thoracic region: Secondary | ICD-10-CM | POA: Diagnosis not present

## 2016-11-17 DIAGNOSIS — M9902 Segmental and somatic dysfunction of thoracic region: Secondary | ICD-10-CM | POA: Diagnosis not present

## 2016-11-17 DIAGNOSIS — Z634 Disappearance and death of family member: Secondary | ICD-10-CM | POA: Diagnosis not present

## 2016-11-17 DIAGNOSIS — M9903 Segmental and somatic dysfunction of lumbar region: Secondary | ICD-10-CM | POA: Diagnosis not present

## 2016-11-17 DIAGNOSIS — M5136 Other intervertebral disc degeneration, lumbar region: Secondary | ICD-10-CM | POA: Diagnosis not present

## 2016-12-01 DIAGNOSIS — F4310 Post-traumatic stress disorder, unspecified: Secondary | ICD-10-CM | POA: Diagnosis not present

## 2016-12-01 DIAGNOSIS — Z634 Disappearance and death of family member: Secondary | ICD-10-CM | POA: Diagnosis not present

## 2016-12-17 DIAGNOSIS — F4310 Post-traumatic stress disorder, unspecified: Secondary | ICD-10-CM | POA: Diagnosis not present

## 2016-12-17 DIAGNOSIS — Z634 Disappearance and death of family member: Secondary | ICD-10-CM | POA: Diagnosis not present

## 2016-12-22 DIAGNOSIS — F4310 Post-traumatic stress disorder, unspecified: Secondary | ICD-10-CM | POA: Diagnosis not present

## 2016-12-22 DIAGNOSIS — Z634 Disappearance and death of family member: Secondary | ICD-10-CM | POA: Diagnosis not present

## 2017-01-05 DIAGNOSIS — Z634 Disappearance and death of family member: Secondary | ICD-10-CM | POA: Diagnosis not present

## 2017-01-05 DIAGNOSIS — F4310 Post-traumatic stress disorder, unspecified: Secondary | ICD-10-CM | POA: Diagnosis not present

## 2017-01-12 DIAGNOSIS — F4310 Post-traumatic stress disorder, unspecified: Secondary | ICD-10-CM | POA: Diagnosis not present

## 2017-01-12 DIAGNOSIS — Z634 Disappearance and death of family member: Secondary | ICD-10-CM | POA: Diagnosis not present

## 2017-01-21 DIAGNOSIS — R7301 Impaired fasting glucose: Secondary | ICD-10-CM | POA: Diagnosis not present

## 2017-01-21 DIAGNOSIS — E782 Mixed hyperlipidemia: Secondary | ICD-10-CM | POA: Diagnosis not present

## 2017-01-21 DIAGNOSIS — R202 Paresthesia of skin: Secondary | ICD-10-CM | POA: Diagnosis not present

## 2017-01-21 DIAGNOSIS — R200 Anesthesia of skin: Secondary | ICD-10-CM | POA: Diagnosis not present

## 2017-01-21 DIAGNOSIS — M542 Cervicalgia: Secondary | ICD-10-CM | POA: Diagnosis not present

## 2017-01-21 DIAGNOSIS — R350 Frequency of micturition: Secondary | ICD-10-CM | POA: Diagnosis not present

## 2017-01-21 DIAGNOSIS — K219 Gastro-esophageal reflux disease without esophagitis: Secondary | ICD-10-CM | POA: Diagnosis not present

## 2017-01-21 DIAGNOSIS — N401 Enlarged prostate with lower urinary tract symptoms: Secondary | ICD-10-CM | POA: Diagnosis not present

## 2017-01-21 DIAGNOSIS — G8929 Other chronic pain: Secondary | ICD-10-CM | POA: Diagnosis not present

## 2017-01-21 DIAGNOSIS — M5442 Lumbago with sciatica, left side: Secondary | ICD-10-CM | POA: Diagnosis not present

## 2017-01-21 DIAGNOSIS — I10 Essential (primary) hypertension: Secondary | ICD-10-CM | POA: Diagnosis not present

## 2017-01-22 DIAGNOSIS — M9903 Segmental and somatic dysfunction of lumbar region: Secondary | ICD-10-CM | POA: Diagnosis not present

## 2017-01-22 DIAGNOSIS — M9905 Segmental and somatic dysfunction of pelvic region: Secondary | ICD-10-CM | POA: Diagnosis not present

## 2017-01-22 DIAGNOSIS — M9904 Segmental and somatic dysfunction of sacral region: Secondary | ICD-10-CM | POA: Diagnosis not present

## 2017-01-22 DIAGNOSIS — M5136 Other intervertebral disc degeneration, lumbar region: Secondary | ICD-10-CM | POA: Diagnosis not present

## 2017-01-27 DIAGNOSIS — M9905 Segmental and somatic dysfunction of pelvic region: Secondary | ICD-10-CM | POA: Diagnosis not present

## 2017-01-27 DIAGNOSIS — M5136 Other intervertebral disc degeneration, lumbar region: Secondary | ICD-10-CM | POA: Diagnosis not present

## 2017-01-27 DIAGNOSIS — M9903 Segmental and somatic dysfunction of lumbar region: Secondary | ICD-10-CM | POA: Diagnosis not present

## 2017-01-27 DIAGNOSIS — M9904 Segmental and somatic dysfunction of sacral region: Secondary | ICD-10-CM | POA: Diagnosis not present

## 2017-02-05 DIAGNOSIS — M9905 Segmental and somatic dysfunction of pelvic region: Secondary | ICD-10-CM | POA: Diagnosis not present

## 2017-02-05 DIAGNOSIS — M9903 Segmental and somatic dysfunction of lumbar region: Secondary | ICD-10-CM | POA: Diagnosis not present

## 2017-02-05 DIAGNOSIS — M9904 Segmental and somatic dysfunction of sacral region: Secondary | ICD-10-CM | POA: Diagnosis not present

## 2017-02-05 DIAGNOSIS — M5136 Other intervertebral disc degeneration, lumbar region: Secondary | ICD-10-CM | POA: Diagnosis not present

## 2017-02-16 DIAGNOSIS — F4310 Post-traumatic stress disorder, unspecified: Secondary | ICD-10-CM | POA: Diagnosis not present

## 2017-02-23 DIAGNOSIS — Z634 Disappearance and death of family member: Secondary | ICD-10-CM | POA: Diagnosis not present

## 2017-02-23 DIAGNOSIS — F4310 Post-traumatic stress disorder, unspecified: Secondary | ICD-10-CM | POA: Diagnosis not present

## 2017-03-01 DIAGNOSIS — M5136 Other intervertebral disc degeneration, lumbar region: Secondary | ICD-10-CM | POA: Diagnosis not present

## 2017-03-01 DIAGNOSIS — M9904 Segmental and somatic dysfunction of sacral region: Secondary | ICD-10-CM | POA: Diagnosis not present

## 2017-03-01 DIAGNOSIS — M9903 Segmental and somatic dysfunction of lumbar region: Secondary | ICD-10-CM | POA: Diagnosis not present

## 2017-03-01 DIAGNOSIS — M9905 Segmental and somatic dysfunction of pelvic region: Secondary | ICD-10-CM | POA: Diagnosis not present

## 2017-03-02 DIAGNOSIS — F4310 Post-traumatic stress disorder, unspecified: Secondary | ICD-10-CM | POA: Diagnosis not present

## 2017-03-04 DIAGNOSIS — M9904 Segmental and somatic dysfunction of sacral region: Secondary | ICD-10-CM | POA: Diagnosis not present

## 2017-03-04 DIAGNOSIS — M9903 Segmental and somatic dysfunction of lumbar region: Secondary | ICD-10-CM | POA: Diagnosis not present

## 2017-03-04 DIAGNOSIS — M5136 Other intervertebral disc degeneration, lumbar region: Secondary | ICD-10-CM | POA: Diagnosis not present

## 2017-03-04 DIAGNOSIS — M9905 Segmental and somatic dysfunction of pelvic region: Secondary | ICD-10-CM | POA: Diagnosis not present

## 2017-03-09 DIAGNOSIS — F4310 Post-traumatic stress disorder, unspecified: Secondary | ICD-10-CM | POA: Diagnosis not present

## 2017-03-09 DIAGNOSIS — Z634 Disappearance and death of family member: Secondary | ICD-10-CM | POA: Diagnosis not present

## 2017-03-16 DIAGNOSIS — T22031A Burn of unspecified degree of right upper arm, initial encounter: Secondary | ICD-10-CM | POA: Diagnosis not present

## 2017-03-16 DIAGNOSIS — Z23 Encounter for immunization: Secondary | ICD-10-CM | POA: Diagnosis not present

## 2017-03-16 DIAGNOSIS — T23022A Burn of unspecified degree of single left finger (nail) except thumb, initial encounter: Secondary | ICD-10-CM | POA: Diagnosis not present

## 2017-03-16 DIAGNOSIS — T300 Burn of unspecified body region, unspecified degree: Secondary | ICD-10-CM | POA: Diagnosis not present

## 2017-03-16 DIAGNOSIS — T24011A Burn of unspecified degree of right thigh, initial encounter: Secondary | ICD-10-CM | POA: Diagnosis not present

## 2017-03-17 DIAGNOSIS — W408XXA Explosion of other specified explosive materials, initial encounter: Secondary | ICD-10-CM | POA: Diagnosis not present

## 2017-03-17 DIAGNOSIS — T310 Burns involving less than 10% of body surface: Secondary | ICD-10-CM | POA: Diagnosis not present

## 2017-03-17 DIAGNOSIS — T23221A Burn of second degree of single right finger (nail) except thumb, initial encounter: Secondary | ICD-10-CM | POA: Diagnosis not present

## 2017-03-17 DIAGNOSIS — Z8679 Personal history of other diseases of the circulatory system: Secondary | ICD-10-CM | POA: Diagnosis not present

## 2017-03-17 DIAGNOSIS — Y999 Unspecified external cause status: Secondary | ICD-10-CM | POA: Diagnosis not present

## 2017-03-17 DIAGNOSIS — T24211A Burn of second degree of right thigh, initial encounter: Secondary | ICD-10-CM | POA: Diagnosis not present

## 2017-03-23 DIAGNOSIS — F4310 Post-traumatic stress disorder, unspecified: Secondary | ICD-10-CM | POA: Diagnosis not present

## 2017-03-23 DIAGNOSIS — Z634 Disappearance and death of family member: Secondary | ICD-10-CM | POA: Diagnosis not present

## 2017-03-24 DIAGNOSIS — I10 Essential (primary) hypertension: Secondary | ICD-10-CM | POA: Diagnosis not present

## 2017-03-24 DIAGNOSIS — T24211A Burn of second degree of right thigh, initial encounter: Secondary | ICD-10-CM | POA: Diagnosis not present

## 2017-03-26 DIAGNOSIS — Z634 Disappearance and death of family member: Secondary | ICD-10-CM | POA: Diagnosis not present

## 2017-03-26 DIAGNOSIS — F4310 Post-traumatic stress disorder, unspecified: Secondary | ICD-10-CM | POA: Diagnosis not present

## 2017-03-31 DIAGNOSIS — T24211A Burn of second degree of right thigh, initial encounter: Secondary | ICD-10-CM | POA: Diagnosis not present

## 2017-04-07 DIAGNOSIS — T24211A Burn of second degree of right thigh, initial encounter: Secondary | ICD-10-CM | POA: Diagnosis not present

## 2017-04-07 DIAGNOSIS — Z09 Encounter for follow-up examination after completed treatment for conditions other than malignant neoplasm: Secondary | ICD-10-CM | POA: Diagnosis not present

## 2017-05-18 DIAGNOSIS — M50322 Other cervical disc degeneration at C5-C6 level: Secondary | ICD-10-CM | POA: Diagnosis not present

## 2017-05-18 DIAGNOSIS — M5136 Other intervertebral disc degeneration, lumbar region: Secondary | ICD-10-CM | POA: Diagnosis not present

## 2017-05-18 DIAGNOSIS — M9901 Segmental and somatic dysfunction of cervical region: Secondary | ICD-10-CM | POA: Diagnosis not present

## 2017-05-18 DIAGNOSIS — M9903 Segmental and somatic dysfunction of lumbar region: Secondary | ICD-10-CM | POA: Diagnosis not present

## 2017-05-20 DIAGNOSIS — M9903 Segmental and somatic dysfunction of lumbar region: Secondary | ICD-10-CM | POA: Diagnosis not present

## 2017-05-20 DIAGNOSIS — M5136 Other intervertebral disc degeneration, lumbar region: Secondary | ICD-10-CM | POA: Diagnosis not present

## 2017-05-20 DIAGNOSIS — M9901 Segmental and somatic dysfunction of cervical region: Secondary | ICD-10-CM | POA: Diagnosis not present

## 2017-05-20 DIAGNOSIS — M50322 Other cervical disc degeneration at C5-C6 level: Secondary | ICD-10-CM | POA: Diagnosis not present

## 2017-05-21 DIAGNOSIS — M9903 Segmental and somatic dysfunction of lumbar region: Secondary | ICD-10-CM | POA: Diagnosis not present

## 2017-05-21 DIAGNOSIS — M50322 Other cervical disc degeneration at C5-C6 level: Secondary | ICD-10-CM | POA: Diagnosis not present

## 2017-05-21 DIAGNOSIS — M5136 Other intervertebral disc degeneration, lumbar region: Secondary | ICD-10-CM | POA: Diagnosis not present

## 2017-05-21 DIAGNOSIS — M9901 Segmental and somatic dysfunction of cervical region: Secondary | ICD-10-CM | POA: Diagnosis not present

## 2017-05-24 DIAGNOSIS — M9901 Segmental and somatic dysfunction of cervical region: Secondary | ICD-10-CM | POA: Diagnosis not present

## 2017-05-24 DIAGNOSIS — M9903 Segmental and somatic dysfunction of lumbar region: Secondary | ICD-10-CM | POA: Diagnosis not present

## 2017-05-24 DIAGNOSIS — M5136 Other intervertebral disc degeneration, lumbar region: Secondary | ICD-10-CM | POA: Diagnosis not present

## 2017-05-24 DIAGNOSIS — M50322 Other cervical disc degeneration at C5-C6 level: Secondary | ICD-10-CM | POA: Diagnosis not present

## 2017-05-25 DIAGNOSIS — M9903 Segmental and somatic dysfunction of lumbar region: Secondary | ICD-10-CM | POA: Diagnosis not present

## 2017-05-25 DIAGNOSIS — M5136 Other intervertebral disc degeneration, lumbar region: Secondary | ICD-10-CM | POA: Diagnosis not present

## 2017-05-25 DIAGNOSIS — M50322 Other cervical disc degeneration at C5-C6 level: Secondary | ICD-10-CM | POA: Diagnosis not present

## 2017-05-25 DIAGNOSIS — M9901 Segmental and somatic dysfunction of cervical region: Secondary | ICD-10-CM | POA: Diagnosis not present

## 2017-06-10 DIAGNOSIS — M25551 Pain in right hip: Secondary | ICD-10-CM | POA: Diagnosis not present

## 2017-06-10 DIAGNOSIS — M545 Low back pain: Secondary | ICD-10-CM | POA: Diagnosis not present

## 2017-08-07 DIAGNOSIS — N390 Urinary tract infection, site not specified: Secondary | ICD-10-CM | POA: Diagnosis not present

## 2017-08-09 DIAGNOSIS — B9620 Unspecified Escherichia coli [E. coli] as the cause of diseases classified elsewhere: Secondary | ICD-10-CM | POA: Diagnosis not present

## 2017-08-09 DIAGNOSIS — N410 Acute prostatitis: Secondary | ICD-10-CM | POA: Diagnosis not present

## 2017-08-09 DIAGNOSIS — N390 Urinary tract infection, site not specified: Secondary | ICD-10-CM | POA: Diagnosis not present

## 2017-08-09 DIAGNOSIS — R338 Other retention of urine: Secondary | ICD-10-CM | POA: Diagnosis not present

## 2017-08-09 DIAGNOSIS — R339 Retention of urine, unspecified: Secondary | ICD-10-CM | POA: Diagnosis not present

## 2017-08-09 DIAGNOSIS — E785 Hyperlipidemia, unspecified: Secondary | ICD-10-CM | POA: Diagnosis not present

## 2017-08-09 DIAGNOSIS — I10 Essential (primary) hypertension: Secondary | ICD-10-CM | POA: Diagnosis not present

## 2017-08-09 DIAGNOSIS — F17290 Nicotine dependence, other tobacco product, uncomplicated: Secondary | ICD-10-CM | POA: Diagnosis not present

## 2017-08-09 DIAGNOSIS — M1990 Unspecified osteoarthritis, unspecified site: Secondary | ICD-10-CM | POA: Diagnosis not present

## 2017-08-17 DIAGNOSIS — N401 Enlarged prostate with lower urinary tract symptoms: Secondary | ICD-10-CM | POA: Diagnosis not present

## 2017-08-17 DIAGNOSIS — R338 Other retention of urine: Secondary | ICD-10-CM | POA: Diagnosis not present

## 2017-09-06 DIAGNOSIS — N390 Urinary tract infection, site not specified: Secondary | ICD-10-CM | POA: Diagnosis not present

## 2017-09-06 DIAGNOSIS — M1990 Unspecified osteoarthritis, unspecified site: Secondary | ICD-10-CM | POA: Diagnosis not present

## 2017-09-06 DIAGNOSIS — E785 Hyperlipidemia, unspecified: Secondary | ICD-10-CM | POA: Diagnosis not present

## 2017-09-06 DIAGNOSIS — I10 Essential (primary) hypertension: Secondary | ICD-10-CM | POA: Diagnosis not present

## 2017-09-23 DIAGNOSIS — R338 Other retention of urine: Secondary | ICD-10-CM | POA: Diagnosis not present

## 2017-09-23 DIAGNOSIS — R351 Nocturia: Secondary | ICD-10-CM | POA: Diagnosis not present

## 2017-09-23 DIAGNOSIS — R3911 Hesitancy of micturition: Secondary | ICD-10-CM | POA: Diagnosis not present

## 2017-09-23 DIAGNOSIS — N403 Nodular prostate with lower urinary tract symptoms: Secondary | ICD-10-CM | POA: Diagnosis not present

## 2017-09-29 DIAGNOSIS — M9905 Segmental and somatic dysfunction of pelvic region: Secondary | ICD-10-CM | POA: Diagnosis not present

## 2017-09-29 DIAGNOSIS — M9903 Segmental and somatic dysfunction of lumbar region: Secondary | ICD-10-CM | POA: Diagnosis not present

## 2017-09-29 DIAGNOSIS — M5136 Other intervertebral disc degeneration, lumbar region: Secondary | ICD-10-CM | POA: Diagnosis not present

## 2017-09-29 DIAGNOSIS — M9904 Segmental and somatic dysfunction of sacral region: Secondary | ICD-10-CM | POA: Diagnosis not present

## 2017-10-01 DIAGNOSIS — M5136 Other intervertebral disc degeneration, lumbar region: Secondary | ICD-10-CM | POA: Diagnosis not present

## 2017-10-01 DIAGNOSIS — M9903 Segmental and somatic dysfunction of lumbar region: Secondary | ICD-10-CM | POA: Diagnosis not present

## 2017-10-01 DIAGNOSIS — M9904 Segmental and somatic dysfunction of sacral region: Secondary | ICD-10-CM | POA: Diagnosis not present

## 2017-10-01 DIAGNOSIS — M9905 Segmental and somatic dysfunction of pelvic region: Secondary | ICD-10-CM | POA: Diagnosis not present

## 2017-11-03 DIAGNOSIS — M1990 Unspecified osteoarthritis, unspecified site: Secondary | ICD-10-CM | POA: Diagnosis not present

## 2017-11-03 DIAGNOSIS — I10 Essential (primary) hypertension: Secondary | ICD-10-CM | POA: Diagnosis not present

## 2017-11-03 DIAGNOSIS — E785 Hyperlipidemia, unspecified: Secondary | ICD-10-CM | POA: Diagnosis not present

## 2017-11-03 DIAGNOSIS — N400 Enlarged prostate without lower urinary tract symptoms: Secondary | ICD-10-CM | POA: Diagnosis not present

## 2017-11-22 DIAGNOSIS — M9905 Segmental and somatic dysfunction of pelvic region: Secondary | ICD-10-CM | POA: Diagnosis not present

## 2017-11-22 DIAGNOSIS — M5136 Other intervertebral disc degeneration, lumbar region: Secondary | ICD-10-CM | POA: Diagnosis not present

## 2017-11-22 DIAGNOSIS — M9904 Segmental and somatic dysfunction of sacral region: Secondary | ICD-10-CM | POA: Diagnosis not present

## 2017-11-22 DIAGNOSIS — M9903 Segmental and somatic dysfunction of lumbar region: Secondary | ICD-10-CM | POA: Diagnosis not present

## 2018-02-01 DIAGNOSIS — M9903 Segmental and somatic dysfunction of lumbar region: Secondary | ICD-10-CM | POA: Diagnosis not present

## 2018-02-01 DIAGNOSIS — M9905 Segmental and somatic dysfunction of pelvic region: Secondary | ICD-10-CM | POA: Diagnosis not present

## 2018-02-01 DIAGNOSIS — M9904 Segmental and somatic dysfunction of sacral region: Secondary | ICD-10-CM | POA: Diagnosis not present

## 2018-02-01 DIAGNOSIS — M5136 Other intervertebral disc degeneration, lumbar region: Secondary | ICD-10-CM | POA: Diagnosis not present

## 2018-02-02 DIAGNOSIS — E785 Hyperlipidemia, unspecified: Secondary | ICD-10-CM | POA: Diagnosis not present

## 2018-02-02 DIAGNOSIS — N400 Enlarged prostate without lower urinary tract symptoms: Secondary | ICD-10-CM | POA: Diagnosis not present

## 2018-02-02 DIAGNOSIS — M1990 Unspecified osteoarthritis, unspecified site: Secondary | ICD-10-CM | POA: Diagnosis not present

## 2018-02-02 DIAGNOSIS — I10 Essential (primary) hypertension: Secondary | ICD-10-CM | POA: Diagnosis not present

## 2018-02-03 DIAGNOSIS — M9903 Segmental and somatic dysfunction of lumbar region: Secondary | ICD-10-CM | POA: Diagnosis not present

## 2018-02-03 DIAGNOSIS — M9904 Segmental and somatic dysfunction of sacral region: Secondary | ICD-10-CM | POA: Diagnosis not present

## 2018-02-03 DIAGNOSIS — M9905 Segmental and somatic dysfunction of pelvic region: Secondary | ICD-10-CM | POA: Diagnosis not present

## 2018-02-03 DIAGNOSIS — M5136 Other intervertebral disc degeneration, lumbar region: Secondary | ICD-10-CM | POA: Diagnosis not present

## 2018-03-08 DIAGNOSIS — M9904 Segmental and somatic dysfunction of sacral region: Secondary | ICD-10-CM | POA: Diagnosis not present

## 2018-03-08 DIAGNOSIS — M5136 Other intervertebral disc degeneration, lumbar region: Secondary | ICD-10-CM | POA: Diagnosis not present

## 2018-03-08 DIAGNOSIS — M9905 Segmental and somatic dysfunction of pelvic region: Secondary | ICD-10-CM | POA: Diagnosis not present

## 2018-03-08 DIAGNOSIS — M9903 Segmental and somatic dysfunction of lumbar region: Secondary | ICD-10-CM | POA: Diagnosis not present

## 2018-03-09 DIAGNOSIS — M5136 Other intervertebral disc degeneration, lumbar region: Secondary | ICD-10-CM | POA: Diagnosis not present

## 2018-03-09 DIAGNOSIS — M9903 Segmental and somatic dysfunction of lumbar region: Secondary | ICD-10-CM | POA: Diagnosis not present

## 2018-03-09 DIAGNOSIS — M9905 Segmental and somatic dysfunction of pelvic region: Secondary | ICD-10-CM | POA: Diagnosis not present

## 2018-03-09 DIAGNOSIS — M9904 Segmental and somatic dysfunction of sacral region: Secondary | ICD-10-CM | POA: Diagnosis not present

## 2018-03-10 DIAGNOSIS — E785 Hyperlipidemia, unspecified: Secondary | ICD-10-CM | POA: Diagnosis not present

## 2018-03-10 DIAGNOSIS — M5136 Other intervertebral disc degeneration, lumbar region: Secondary | ICD-10-CM | POA: Diagnosis not present

## 2018-03-10 DIAGNOSIS — I10 Essential (primary) hypertension: Secondary | ICD-10-CM | POA: Diagnosis not present

## 2018-03-10 DIAGNOSIS — M9905 Segmental and somatic dysfunction of pelvic region: Secondary | ICD-10-CM | POA: Diagnosis not present

## 2018-03-10 DIAGNOSIS — M1990 Unspecified osteoarthritis, unspecified site: Secondary | ICD-10-CM | POA: Diagnosis not present

## 2018-03-10 DIAGNOSIS — J029 Acute pharyngitis, unspecified: Secondary | ICD-10-CM | POA: Diagnosis not present

## 2018-03-10 DIAGNOSIS — N400 Enlarged prostate without lower urinary tract symptoms: Secondary | ICD-10-CM | POA: Diagnosis not present

## 2018-03-10 DIAGNOSIS — M9904 Segmental and somatic dysfunction of sacral region: Secondary | ICD-10-CM | POA: Diagnosis not present

## 2018-03-10 DIAGNOSIS — M9903 Segmental and somatic dysfunction of lumbar region: Secondary | ICD-10-CM | POA: Diagnosis not present

## 2018-03-17 DIAGNOSIS — M9905 Segmental and somatic dysfunction of pelvic region: Secondary | ICD-10-CM | POA: Diagnosis not present

## 2018-03-17 DIAGNOSIS — M9904 Segmental and somatic dysfunction of sacral region: Secondary | ICD-10-CM | POA: Diagnosis not present

## 2018-03-17 DIAGNOSIS — M9903 Segmental and somatic dysfunction of lumbar region: Secondary | ICD-10-CM | POA: Diagnosis not present

## 2018-03-17 DIAGNOSIS — M5136 Other intervertebral disc degeneration, lumbar region: Secondary | ICD-10-CM | POA: Diagnosis not present

## 2018-04-25 DIAGNOSIS — R358 Other polyuria: Secondary | ICD-10-CM | POA: Diagnosis not present

## 2018-04-25 DIAGNOSIS — M1990 Unspecified osteoarthritis, unspecified site: Secondary | ICD-10-CM | POA: Diagnosis not present

## 2018-04-25 DIAGNOSIS — N400 Enlarged prostate without lower urinary tract symptoms: Secondary | ICD-10-CM | POA: Diagnosis not present

## 2018-04-25 DIAGNOSIS — E785 Hyperlipidemia, unspecified: Secondary | ICD-10-CM | POA: Diagnosis not present

## 2018-04-25 DIAGNOSIS — I10 Essential (primary) hypertension: Secondary | ICD-10-CM | POA: Diagnosis not present

## 2018-06-01 DIAGNOSIS — M9904 Segmental and somatic dysfunction of sacral region: Secondary | ICD-10-CM | POA: Diagnosis not present

## 2018-06-01 DIAGNOSIS — M9905 Segmental and somatic dysfunction of pelvic region: Secondary | ICD-10-CM | POA: Diagnosis not present

## 2018-06-01 DIAGNOSIS — M5136 Other intervertebral disc degeneration, lumbar region: Secondary | ICD-10-CM | POA: Diagnosis not present

## 2018-06-01 DIAGNOSIS — M9903 Segmental and somatic dysfunction of lumbar region: Secondary | ICD-10-CM | POA: Diagnosis not present

## 2018-06-02 DIAGNOSIS — M9905 Segmental and somatic dysfunction of pelvic region: Secondary | ICD-10-CM | POA: Diagnosis not present

## 2018-06-02 DIAGNOSIS — M9904 Segmental and somatic dysfunction of sacral region: Secondary | ICD-10-CM | POA: Diagnosis not present

## 2018-06-02 DIAGNOSIS — M9903 Segmental and somatic dysfunction of lumbar region: Secondary | ICD-10-CM | POA: Diagnosis not present

## 2018-06-02 DIAGNOSIS — M5136 Other intervertebral disc degeneration, lumbar region: Secondary | ICD-10-CM | POA: Diagnosis not present

## 2018-06-08 DIAGNOSIS — M9904 Segmental and somatic dysfunction of sacral region: Secondary | ICD-10-CM | POA: Diagnosis not present

## 2018-06-08 DIAGNOSIS — M9903 Segmental and somatic dysfunction of lumbar region: Secondary | ICD-10-CM | POA: Diagnosis not present

## 2018-06-08 DIAGNOSIS — M5136 Other intervertebral disc degeneration, lumbar region: Secondary | ICD-10-CM | POA: Diagnosis not present

## 2018-06-08 DIAGNOSIS — M9905 Segmental and somatic dysfunction of pelvic region: Secondary | ICD-10-CM | POA: Diagnosis not present

## 2018-06-09 DIAGNOSIS — M9903 Segmental and somatic dysfunction of lumbar region: Secondary | ICD-10-CM | POA: Diagnosis not present

## 2018-06-09 DIAGNOSIS — M5136 Other intervertebral disc degeneration, lumbar region: Secondary | ICD-10-CM | POA: Diagnosis not present

## 2018-06-09 DIAGNOSIS — M9904 Segmental and somatic dysfunction of sacral region: Secondary | ICD-10-CM | POA: Diagnosis not present

## 2018-06-09 DIAGNOSIS — M9905 Segmental and somatic dysfunction of pelvic region: Secondary | ICD-10-CM | POA: Diagnosis not present

## 2018-07-19 DIAGNOSIS — I10 Essential (primary) hypertension: Secondary | ICD-10-CM | POA: Diagnosis not present

## 2018-07-19 DIAGNOSIS — E785 Hyperlipidemia, unspecified: Secondary | ICD-10-CM | POA: Diagnosis not present

## 2018-07-19 DIAGNOSIS — M1990 Unspecified osteoarthritis, unspecified site: Secondary | ICD-10-CM | POA: Diagnosis not present

## 2018-07-19 DIAGNOSIS — J029 Acute pharyngitis, unspecified: Secondary | ICD-10-CM | POA: Diagnosis not present

## 2018-07-19 DIAGNOSIS — N400 Enlarged prostate without lower urinary tract symptoms: Secondary | ICD-10-CM | POA: Diagnosis not present

## 2018-08-01 DIAGNOSIS — E785 Hyperlipidemia, unspecified: Secondary | ICD-10-CM | POA: Diagnosis not present

## 2018-08-01 DIAGNOSIS — I10 Essential (primary) hypertension: Secondary | ICD-10-CM | POA: Diagnosis not present

## 2019-01-13 DIAGNOSIS — I10 Essential (primary) hypertension: Secondary | ICD-10-CM | POA: Diagnosis not present

## 2019-01-13 DIAGNOSIS — N400 Enlarged prostate without lower urinary tract symptoms: Secondary | ICD-10-CM | POA: Diagnosis not present

## 2019-01-13 DIAGNOSIS — E785 Hyperlipidemia, unspecified: Secondary | ICD-10-CM | POA: Diagnosis not present

## 2019-01-13 DIAGNOSIS — M1990 Unspecified osteoarthritis, unspecified site: Secondary | ICD-10-CM | POA: Diagnosis not present

## 2019-02-07 DIAGNOSIS — M5136 Other intervertebral disc degeneration, lumbar region: Secondary | ICD-10-CM | POA: Diagnosis not present

## 2019-02-07 DIAGNOSIS — M9905 Segmental and somatic dysfunction of pelvic region: Secondary | ICD-10-CM | POA: Diagnosis not present

## 2019-02-07 DIAGNOSIS — M9904 Segmental and somatic dysfunction of sacral region: Secondary | ICD-10-CM | POA: Diagnosis not present

## 2019-02-07 DIAGNOSIS — M9903 Segmental and somatic dysfunction of lumbar region: Secondary | ICD-10-CM | POA: Diagnosis not present

## 2019-02-23 ENCOUNTER — Emergency Department (HOSPITAL_COMMUNITY)
Admission: EM | Admit: 2019-02-23 | Discharge: 2019-02-23 | Discharge status: Against Medical Advice | Payer: Medicare Other

## 2019-03-06 DIAGNOSIS — N400 Enlarged prostate without lower urinary tract symptoms: Secondary | ICD-10-CM | POA: Diagnosis not present

## 2019-03-06 DIAGNOSIS — E785 Hyperlipidemia, unspecified: Secondary | ICD-10-CM | POA: Diagnosis not present

## 2019-03-06 DIAGNOSIS — M791 Myalgia, unspecified site: Secondary | ICD-10-CM | POA: Diagnosis not present

## 2019-03-06 DIAGNOSIS — M1990 Unspecified osteoarthritis, unspecified site: Secondary | ICD-10-CM | POA: Diagnosis not present

## 2019-03-06 DIAGNOSIS — I10 Essential (primary) hypertension: Secondary | ICD-10-CM | POA: Diagnosis not present

## 2019-03-21 DIAGNOSIS — M25562 Pain in left knee: Secondary | ICD-10-CM | POA: Diagnosis not present

## 2019-03-21 DIAGNOSIS — M25512 Pain in left shoulder: Secondary | ICD-10-CM | POA: Diagnosis not present

## 2019-03-21 DIAGNOSIS — M542 Cervicalgia: Secondary | ICD-10-CM | POA: Diagnosis not present

## 2019-03-21 DIAGNOSIS — M545 Low back pain: Secondary | ICD-10-CM | POA: Diagnosis not present

## 2019-04-08 DIAGNOSIS — M25562 Pain in left knee: Secondary | ICD-10-CM | POA: Diagnosis not present

## 2019-04-13 DIAGNOSIS — M25512 Pain in left shoulder: Secondary | ICD-10-CM | POA: Diagnosis not present

## 2019-04-19 DIAGNOSIS — M25512 Pain in left shoulder: Secondary | ICD-10-CM | POA: Diagnosis not present

## 2019-04-19 DIAGNOSIS — M25562 Pain in left knee: Secondary | ICD-10-CM | POA: Diagnosis not present

## 2019-04-19 DIAGNOSIS — M545 Low back pain: Secondary | ICD-10-CM | POA: Diagnosis not present

## 2019-05-01 DIAGNOSIS — M545 Low back pain: Secondary | ICD-10-CM | POA: Diagnosis not present

## 2019-05-08 DIAGNOSIS — M545 Low back pain: Secondary | ICD-10-CM | POA: Diagnosis not present

## 2019-05-08 DIAGNOSIS — M25562 Pain in left knee: Secondary | ICD-10-CM | POA: Diagnosis not present

## 2019-05-22 DIAGNOSIS — M545 Low back pain: Secondary | ICD-10-CM | POA: Diagnosis not present

## 2019-05-30 DIAGNOSIS — M545 Low back pain: Secondary | ICD-10-CM | POA: Diagnosis not present

## 2019-06-05 DIAGNOSIS — M545 Low back pain: Secondary | ICD-10-CM | POA: Diagnosis not present

## 2019-06-19 DIAGNOSIS — M545 Low back pain: Secondary | ICD-10-CM | POA: Diagnosis not present

## 2019-06-26 DIAGNOSIS — M545 Low back pain: Secondary | ICD-10-CM | POA: Diagnosis not present

## 2019-06-28 DIAGNOSIS — M545 Low back pain: Secondary | ICD-10-CM | POA: Diagnosis not present

## 2019-07-11 DIAGNOSIS — M545 Low back pain: Secondary | ICD-10-CM | POA: Diagnosis not present

## 2019-07-13 DIAGNOSIS — M25512 Pain in left shoulder: Secondary | ICD-10-CM | POA: Diagnosis not present

## 2019-07-13 DIAGNOSIS — M545 Low back pain: Secondary | ICD-10-CM | POA: Diagnosis not present

## 2019-07-13 DIAGNOSIS — M25562 Pain in left knee: Secondary | ICD-10-CM | POA: Diagnosis not present

## 2019-07-19 DIAGNOSIS — S83242A Other tear of medial meniscus, current injury, left knee, initial encounter: Secondary | ICD-10-CM | POA: Diagnosis not present

## 2019-08-18 DIAGNOSIS — E785 Hyperlipidemia, unspecified: Secondary | ICD-10-CM | POA: Diagnosis not present

## 2019-08-18 DIAGNOSIS — I10 Essential (primary) hypertension: Secondary | ICD-10-CM | POA: Diagnosis not present

## 2019-08-20 DIAGNOSIS — N410 Acute prostatitis: Secondary | ICD-10-CM | POA: Diagnosis not present

## 2019-09-18 DIAGNOSIS — I10 Essential (primary) hypertension: Secondary | ICD-10-CM | POA: Diagnosis not present

## 2019-09-18 DIAGNOSIS — M1990 Unspecified osteoarthritis, unspecified site: Secondary | ICD-10-CM | POA: Diagnosis not present

## 2019-09-18 DIAGNOSIS — E785 Hyperlipidemia, unspecified: Secondary | ICD-10-CM | POA: Diagnosis not present

## 2019-09-18 DIAGNOSIS — N400 Enlarged prostate without lower urinary tract symptoms: Secondary | ICD-10-CM | POA: Diagnosis not present

## 2019-09-25 ENCOUNTER — Other Ambulatory Visit: Payer: Self-pay

## 2019-09-25 ENCOUNTER — Encounter (HOSPITAL_BASED_OUTPATIENT_CLINIC_OR_DEPARTMENT_OTHER): Payer: Self-pay | Admitting: Specialist

## 2019-09-25 NOTE — Progress Notes (Addendum)
Spoke w/ via phone for pre-op interview--- PT Lab needs dos----  Istat 8 and EKG             Lab results------ no COVID test ------ 09-26-2019 @ 0840 Arrive at ------- 0900 NPO after ------ MN Medications to take morning of surgery ----- Norvasc, Flomax w/ sips of water Diabetic medication ----- no meds Patient Special Instructions ----- n/a Pre-Op special Istructions ----- n/a Patient verbalized understanding of instructions that were given at this phone interview. Patient denies shortness of breath, chest pain, fever, cough a this phone interview. 
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
## 2019-09-25 NOTE — H&P (View-Only) (Signed)
Spoke w/ via phone for pre-op interview--- PT Lab needs dos----  Istat 8 and EKG             Lab results------ no COVID test ------ 09-26-2019 @ 0840 Arrive at ------- 0900 NPO after ------ MN Medications to take morning of surgery ----- Norvasc, Flomax w/ sips of water Diabetic medication ----- no meds Patient Special Instructions ----- n/a Pre-Op special Istructions ----- n/a Patient verbalized understanding of instructions that were given at this phone interview. Patient denies shortness of breath, chest pain, fever, cough a this phone interview.

## 2019-09-26 ENCOUNTER — Other Ambulatory Visit (HOSPITAL_COMMUNITY)
Admission: RE | Admit: 2019-09-26 | Discharge: 2019-09-26 | Disposition: A | Discharge status: Home / Self Care | Payer: Medicare (Managed Care) | Source: Ambulatory Visit | Attending: Specialist | Admitting: Specialist

## 2019-09-26 ENCOUNTER — Other Ambulatory Visit (HOSPITAL_COMMUNITY): Payer: Medicare (Managed Care)

## 2019-09-26 DIAGNOSIS — Z20822 Contact with and (suspected) exposure to covid-19: Secondary | ICD-10-CM | POA: Insufficient documentation

## 2019-09-26 DIAGNOSIS — Z01812 Encounter for preprocedural laboratory examination: Secondary | ICD-10-CM | POA: Diagnosis present

## 2019-09-26 LAB — SARS CORONAVIRUS 2 (TAT 6-24 HRS): SARS Coronavirus 2: NEGATIVE

## 2019-09-28 ENCOUNTER — Encounter (HOSPITAL_BASED_OUTPATIENT_CLINIC_OR_DEPARTMENT_OTHER): Payer: Self-pay | Admitting: Specialist

## 2019-09-28 ENCOUNTER — Ambulatory Visit (HOSPITAL_BASED_OUTPATIENT_CLINIC_OR_DEPARTMENT_OTHER): Payer: Medicare (Managed Care) | Admitting: Anesthesiology

## 2019-09-28 ENCOUNTER — Encounter (HOSPITAL_BASED_OUTPATIENT_CLINIC_OR_DEPARTMENT_OTHER)
Admission: RE | Disposition: A | Discharge status: Home / Self Care | Payer: Self-pay | Source: Home / Self Care | Attending: Specialist

## 2019-09-28 ENCOUNTER — Ambulatory Visit (HOSPITAL_BASED_OUTPATIENT_CLINIC_OR_DEPARTMENT_OTHER)
Admission: RE | Admit: 2019-09-28 | Discharge: 2019-09-28 | Disposition: A | Discharge status: Home / Self Care | Payer: Medicare (Managed Care) | Attending: Specialist | Admitting: Specialist

## 2019-09-28 DIAGNOSIS — M94262 Chondromalacia, left knee: Secondary | ICD-10-CM | POA: Insufficient documentation

## 2019-09-28 DIAGNOSIS — I10 Essential (primary) hypertension: Secondary | ICD-10-CM | POA: Insufficient documentation

## 2019-09-28 DIAGNOSIS — S83232A Complex tear of medial meniscus, current injury, left knee, initial encounter: Secondary | ICD-10-CM | POA: Diagnosis present

## 2019-09-28 DIAGNOSIS — K219 Gastro-esophageal reflux disease without esophagitis: Secondary | ICD-10-CM | POA: Diagnosis not present

## 2019-09-28 DIAGNOSIS — Z87891 Personal history of nicotine dependence: Secondary | ICD-10-CM | POA: Insufficient documentation

## 2019-09-28 HISTORY — DX: Prediabetes: R73.03

## 2019-09-28 HISTORY — PX: KNEE ARTHROSCOPY WITH MEDIAL MENISECTOMY: SHX5651

## 2019-09-28 HISTORY — DX: Unspecified tear of unspecified meniscus, current injury, left knee, initial encounter: S83.207A

## 2019-09-28 HISTORY — DX: Presence of spectacles and contact lenses: Z97.3

## 2019-09-28 HISTORY — DX: Gastro-esophageal reflux disease without esophagitis: K21.9

## 2019-09-28 HISTORY — DX: Benign prostatic hyperplasia without lower urinary tract symptoms: N40.0

## 2019-09-28 LAB — POCT I-STAT, CHEM 8
BUN: 18 mg/dL (ref 8–23)
Calcium, Ion: 1.25 mmol/L (ref 1.15–1.40)
Chloride: 106 mmol/L (ref 98–111)
Creatinine, Ser: 0.9 mg/dL (ref 0.61–1.24)
Glucose, Bld: 84 mg/dL (ref 70–99)
HCT: 47 % (ref 39.0–52.0)
Hemoglobin: 16 g/dL (ref 13.0–17.0)
Potassium: 5 mmol/L (ref 3.5–5.1)
Sodium: 141 mmol/L (ref 135–145)
TCO2: 24 mmol/L (ref 22–32)

## 2019-09-28 SURGERY — ARTHROSCOPY, KNEE, WITH MEDIAL MENISCECTOMY
Anesthesia: General | Site: Knee | Laterality: Left

## 2019-09-28 MED ORDER — CEPHALEXIN 500 MG PO CAPS: 500.0000 mg | ORAL_CAPSULE | Freq: Four times a day (QID) | ORAL | 0 refills | Status: AC

## 2019-09-28 MED ORDER — MORPHINE SULFATE (PF) 4 MG/ML IV SOLN
INTRAVENOUS | Status: AC
  Filled 2019-09-28: qty 1

## 2019-09-28 MED ORDER — PROPOFOL 10 MG/ML IV BOLUS
INTRAVENOUS | Status: AC
  Filled 2019-09-28: qty 20

## 2019-09-28 MED ORDER — CEFAZOLIN SODIUM-DEXTROSE 2-4 GM/100ML-% IV SOLN
INTRAVENOUS | Status: AC
  Filled 2019-09-28: qty 100

## 2019-09-28 MED ORDER — CEFAZOLIN SODIUM-DEXTROSE 2-4 GM/100ML-% IV SOLN
2.0000 g | INTRAVENOUS | Status: AC
  Administered 2019-09-28: 13:00:00 2 g via INTRAVENOUS
  Filled 2019-09-28: qty 100

## 2019-09-28 MED ORDER — METHOCARBAMOL 500 MG PO TABS: 500.0000 mg | ORAL_TABLET | Freq: Four times a day (QID) | ORAL | 0 refills | Status: DC

## 2019-09-28 MED ORDER — FENTANYL CITRATE (PF) 100 MCG/2ML IJ SOLN
INTRAMUSCULAR | Status: AC
  Filled 2019-09-28: qty 2

## 2019-09-28 MED ORDER — MEPERIDINE HCL 25 MG/ML IJ SOLN
6.2500 mg | INTRAMUSCULAR | Status: DC | PRN
  Filled 2019-09-28: qty 1

## 2019-09-28 MED ORDER — PROPOFOL 10 MG/ML IV BOLUS
INTRAVENOUS | Status: DC | PRN
  Administered 2019-09-28: 40 mg via INTRAVENOUS
  Administered 2019-09-28: 160 mg via INTRAVENOUS

## 2019-09-28 MED ORDER — MORPHINE SULFATE (PF) 4 MG/ML IV SOLN
INTRAVENOUS | Status: DC | PRN
  Administered 2019-09-28: 4 mg via INTRAMUSCULAR

## 2019-09-28 MED ORDER — OXYCODONE HCL 5 MG PO TABS: 5.0000 mg | ORAL_TABLET | ORAL | 0 refills | Status: AC | PRN

## 2019-09-28 MED ORDER — LIDOCAINE 2% (20 MG/ML) 5 ML SYRINGE
INTRAMUSCULAR | Status: AC
  Filled 2019-09-28: qty 5

## 2019-09-28 MED ORDER — ONDANSETRON HCL 4 MG/2ML IJ SOLN
INTRAMUSCULAR | Status: DC | PRN
  Administered 2019-09-28: 4 mg via INTRAVENOUS

## 2019-09-28 MED ORDER — HYDROMORPHONE HCL 1 MG/ML IJ SOLN
0.2500 mg | INTRAMUSCULAR | Status: DC | PRN
  Filled 2019-09-28: qty 0.5

## 2019-09-28 MED ORDER — FENTANYL CITRATE (PF) 100 MCG/2ML IJ SOLN
INTRAMUSCULAR | Status: DC | PRN
  Administered 2019-09-28 (×4): 50 ug via INTRAVENOUS

## 2019-09-28 MED ORDER — LACTATED RINGERS IV SOLN
INTRAVENOUS | Status: DC
  Filled 2019-09-28: qty 1000

## 2019-09-28 MED ORDER — LIDOCAINE 2% (20 MG/ML) 5 ML SYRINGE
INTRAMUSCULAR | Status: DC | PRN
  Administered 2019-09-28: 100 mg via INTRAVENOUS

## 2019-09-28 MED ORDER — SODIUM CHLORIDE 0.9 % IR SOLN
Status: DC | PRN
  Administered 2019-09-28: 6000 mL

## 2019-09-28 MED ORDER — ONDANSETRON HCL 4 MG PO TABS: 4.0000 mg | ORAL_TABLET | Freq: Every day | ORAL | 1 refills | Status: AC | PRN

## 2019-09-28 MED ORDER — ONDANSETRON HCL 4 MG/2ML IJ SOLN
4.0000 mg | Freq: Once | INTRAMUSCULAR | Status: DC | PRN
  Filled 2019-09-28: qty 2

## 2019-09-28 MED ORDER — BUPIVACAINE HCL 0.25 % IJ SOLN
INTRAMUSCULAR | Status: DC | PRN
  Administered 2019-09-28: 30 mL

## 2019-09-28 MED ORDER — MIDAZOLAM HCL 2 MG/2ML IJ SOLN
INTRAMUSCULAR | Status: AC
  Filled 2019-09-28: qty 2

## 2019-09-28 MED ORDER — MIDAZOLAM HCL 5 MG/5ML IJ SOLN
INTRAMUSCULAR | Status: DC | PRN
  Administered 2019-09-28: 2 mg via INTRAVENOUS

## 2019-09-28 MED ORDER — ONDANSETRON HCL 4 MG/2ML IJ SOLN
INTRAMUSCULAR | Status: AC
  Filled 2019-09-28: qty 2

## 2019-09-28 SURGICAL SUPPLY — 47 items
ABLATOR ASPIRATE 50D MULTI-PRT (SURGICAL WAND) ×2 IMPLANT
BANDAGE ESMARK 6X9 LF (GAUZE/BANDAGES/DRESSINGS) ×1 IMPLANT
BLADE EXCALIBUR 4.0MM X 13CM (MISCELLANEOUS) ×1
BLADE EXCALIBUR 4.0X13 (MISCELLANEOUS) ×2 IMPLANT
BNDG CMPR 9X6 STRL LF SNTH (GAUZE/BANDAGES/DRESSINGS) ×1
BNDG ELASTIC 6X5.8 VLCR STR LF (GAUZE/BANDAGES/DRESSINGS) ×2 IMPLANT
BNDG ESMARK 6X9 LF (GAUZE/BANDAGES/DRESSINGS) ×3
BNDG GAUZE ELAST 4 BULKY (GAUZE/BANDAGES/DRESSINGS) ×3 IMPLANT
CANISTER SUCT 1200ML W/VALVE (MISCELLANEOUS) ×3 IMPLANT
CNTNR URN SCR LID CUP LEK RST (MISCELLANEOUS) IMPLANT
CONT SPEC 4OZ STRL OR WHT (MISCELLANEOUS)
COVER WAND RF STERILE (DRAPES) ×3 IMPLANT
CUFF TOURN SGL QUICK 34 (TOURNIQUET CUFF) ×3
CUFF TRNQT CYL 34X4.125X (TOURNIQUET CUFF) ×2 IMPLANT
DRAPE ARTHROSCOPY W/POUCH 114 (DRAPES) ×3 IMPLANT
DRAPE INCISE IOBAN 66X45 STRL (DRAPES) ×3 IMPLANT
DRAPE U-SHAPE 47X51 STRL (DRAPES) ×1 IMPLANT
DRSG PAD ABDOMINAL 8X10 ST (GAUZE/BANDAGES/DRESSINGS) ×2 IMPLANT
DURAPREP 26ML APPLICATOR (WOUND CARE) ×3 IMPLANT
EXCALIBUR 3.8MM X 13CM (MISCELLANEOUS) ×3 IMPLANT
GAUZE SPONGE 4X4 12PLY STRL (GAUZE/BANDAGES/DRESSINGS) ×3 IMPLANT
GAUZE SPONGE 4X4 12PLY STRL LF (GAUZE/BANDAGES/DRESSINGS) ×2 IMPLANT
GAUZE XEROFORM 1X8 LF (GAUZE/BANDAGES/DRESSINGS) ×3 IMPLANT
GLOVE BIO SURGEON STRL SZ 6.5 (GLOVE) ×2 IMPLANT
GLOVE BIO SURGEON STRL SZ7 (GLOVE) ×3 IMPLANT
GLOVE BIO SURGEON STRL SZ8 (GLOVE) ×3 IMPLANT
GLOVE BIO SURGEONS STRL SZ 6.5 (GLOVE) ×1
GLOVE BIOGEL PI IND STRL 7.0 (GLOVE) ×2 IMPLANT
GLOVE BIOGEL PI IND STRL 7.5 (GLOVE) ×1 IMPLANT
GLOVE BIOGEL PI INDICATOR 7.0 (GLOVE) ×4
GLOVE BIOGEL PI INDICATOR 7.5 (GLOVE) ×2
GLOVE INDICATOR 8.0 STRL GRN (GLOVE) ×3 IMPLANT
GOWN STRL REUS W/TWL XL LVL3 (GOWN DISPOSABLE) ×6 IMPLANT
IV NS IRRIG 3000ML ARTHROMATIC (IV SOLUTION) ×6 IMPLANT
KIT TURNOVER CYSTO (KITS) ×3 IMPLANT
MANIFOLD NEPTUNE II (INSTRUMENTS) ×3 IMPLANT
NEEDLE HYPO 22GX1.5 SAFETY (NEEDLE) ×3 IMPLANT
PACK ARTHROSCOPY DSU (CUSTOM PROCEDURE TRAY) ×3 IMPLANT
PACK BASIN DAY SURGERY FS (CUSTOM PROCEDURE TRAY) ×3 IMPLANT
PAD ARMBOARD 7.5X6 YLW CONV (MISCELLANEOUS) IMPLANT
SUT ETHILON 4 0 PS 2 18 (SUTURE) ×3 IMPLANT
SYR CONTROL 10ML LL (SYRINGE) ×3 IMPLANT
TOWEL OR 17X26 10 PK STRL BLUE (TOWEL DISPOSABLE) ×4 IMPLANT
TUBE CONNECTING 12'X1/4 (SUCTIONS) ×3
TUBE CONNECTING 12X1/4 (SUCTIONS) ×4 IMPLANT
TUBING ARTHROSCOPY IRRIG 16FT (MISCELLANEOUS) ×3 IMPLANT
WATER STERILE IRR 500ML POUR (IV SOLUTION) ×3 IMPLANT

## 2019-09-28 NOTE — Transfer of Care (Signed)
Immediate Anesthesia Transfer of Care Note  Patient: Daniel Norris  Procedure(s) Performed: KNEE ARTHROSCOPY WITH PARTIAL MEDIAL MENISECTOMY (Left Knee)  Patient Location: PACU  Anesthesia Type:General  Level of Consciousness: awake, alert , oriented and patient cooperative  Airway & Oxygen Therapy: Patient Spontanous Breathing and Patient connected to nasal cannula oxygen  Post-op Assessment: Report given to RN and Post -op Vital signs reviewed and stable  Post vital signs: Reviewed and stable  Last Vitals:  Vitals Value Taken Time  BP 131/94 09/28/19 1339  Temp    Pulse 66 09/28/19 1341  Resp 13 09/28/19 1341  SpO2 100 % 09/28/19 1341  Vitals shown include unvalidated device data.  Last Pain:  Vitals:   09/28/19 0931  TempSrc: Oral  PainSc: 0-No pain      Patients Stated Pain Goal: 3 (09/28/19 0931)  Complications: No apparent anesthesia complications

## 2019-09-28 NOTE — Op Note (Signed)
NAME: Daniel Norris, Daniel Norris MEDICAL RECORD [id number] ACCOUNT [id number] DATE OF BIRTH:1955-12-18 FACILITY: [facility] LOCATION: [facility] PHYSICIAN: Jim Desanctis, MD  OPERATIVE REPORT  DATE OF PROCEDURE:  09/28/2019  PREOPERATIVE DIAGNOSIS:  Left knee torn medial meniscus.  POSTOPERATIVE DIAGNOSES:   1.  Left knee complex tear of medial meniscus.   2.  Mild chondromalacia medial femoral condyle.  PROCEDURES:   1.  Left knee arthroscopic partial medial meniscectomy.  2.  Chondroplasty of medial femoral condyle.  SURGEON:  Erasmo Leventhal, MD   ANESTHESIA:  General with intraoperative knee block per myself.  ESTIMATED BLOOD LOSS:  Minimal.  DRAINS:  None.  COMPLICATIONS:  None.  TOURNIQUET TIME:  None.  DISPOSITION:  PACU, stable.  DESCRIPTION OF PROCEDURE:  In the preoperative holding area, correct site was identified, marked and signed appropriately.  IV had been started.  IV antibiotics were given.  TED hose was applied to the uninvolved leg.  No block was administered per the  anesthesiologist per his discretion.  He was then taken to the operating room and placed in the supine position under general anesthesia. The left thigh placed in a thigh holder, prepped with DuraPrep and draped in sterile fashion.  Time-out done,  confirming the left side.  Arthroscopic portals were established, proximal medial, inferomedial, inferolateral.  Diagnostic arthroscopy was undertaken.  Patellofemoral joint revealed normal articular cartilage and tracking.  Suprapatellar pouch, medial  and lateral gutters unremarkable.  ACL and PCL were intact.  Medial side was inspected.  Articular cartilage showed mild chondromalacia of the medial femoral condyle and a very light chondroplasty performed.  He had a complex tear involving the posterior  third of medial meniscus and basket, motorized shaver, and cautery, partial meniscectomy was performed back to stable base, proper beveled and contoured  and smoothed down nicely.  ACL and PCL were intact.  Lateral side inspected.  Normal articular  cartilage, normal lateral meniscus.  There were no other abnormalities noted.  Irrigated and arthroscope was removed.  The portals were closed with 4-0 nylon suture.  Ten mL of 0.25% bupivacaine placed into the skin and 20 mL was placed into the knee.   Sterile dressing was applied.  TED hose, ice pack.  He tolerated the procedure well.  There were no complications.  He was taken from the operating room to the PACU in stable condition.  He will be stabilized in PACU and discharged to home.  I will see  him back in the office in 2 weeks.  Start therapy next week.  I think his prognosis is excellent.  VN/NUANCE  D:09/28/2019 T:09/28/2019 JOB:[id number]

## 2019-09-28 NOTE — Op Note (Signed)
Dictated#[id number]

## 2019-09-28 NOTE — Anesthesia Preprocedure Evaluation (Signed)
Anesthesia Evaluation  Patient identified by MRN, date of birth, ID band Patient awake    Reviewed: Allergy & Precautions, NPO status , Patient's Chart, lab work & pertinent test results  Airway Mallampati: I  TM Distance: >3 FB Neck ROM: Full    Dental   Pulmonary Patient abstained from smoking., former smoker,    Pulmonary exam normal        Cardiovascular hypertension, Pt. on medications Normal cardiovascular exam     Neuro/Psych    GI/Hepatic GERD  Medicated and Controlled,  Endo/Other    Renal/GU      Musculoskeletal   Abdominal   Peds  Hematology   Anesthesia Other Findings   Reproductive/Obstetrics                             Anesthesia Physical Anesthesia Plan  ASA: II  Anesthesia Plan: General   Post-op Pain Management:    Induction: Intravenous  PONV Risk Score and Plan: 2 and Ondansetron and Midazolam  Airway Management Planned: LMA  Additional Equipment:   Intra-op Plan:   Post-operative Plan: Extubation in OR  Informed Consent: I have reviewed the patients History and Physical, chart, labs and discussed the procedure including the risks, benefits and alternatives for the proposed anesthesia with the patient or authorized representative who has indicated his/her understanding and acceptance.       Plan Discussed with: CRNA and Surgeon  Anesthesia Plan Comments:         Anesthesia Quick Evaluation

## 2019-09-28 NOTE — Discharge Instructions (Signed)
Weight bearing as tolerated. Keep TED hose on until your follow-up appointment.  Post Anesthesia Home Care Instructions  Activity: Get plenty of rest for the remainder of the day. A responsible individual must stay with you for 24 hours following the procedure.  For the next 24 hours, DO NOT: -Drive a car -Advertising copywriter -Drink alcoholic beverages -Take any medication unless instructed by your physician -Make any legal decisions or sign important papers.  Meals: Start with liquid foods such as gelatin or soup. Progress to regular foods as tolerated. Avoid greasy, spicy, heavy foods. If nausea and/or vomiting occur, drink only clear liquids until the nausea and/or vomiting subsides. Call your physician if vomiting continues.  Special Instructions/Symptoms: Your throat may feel dry or sore from the anesthesia or the breathing tube placed in your throat during surgery. If this causes discomfort, gargle with warm salt water. The discomfort should disappear within 24 hours.

## 2019-09-28 NOTE — Anesthesia Procedure Notes (Signed)
Procedure Name: LMA Insertion Date/Time: 09/28/2019 12:29 PM Performed by: Elyn Peers, CRNA Pre-anesthesia Checklist: Patient identified, Emergency Drugs available, Suction available, Patient being monitored and Timeout performed Patient Re-evaluated:Patient Re-evaluated prior to induction Oxygen Delivery Method: Circle system utilized Preoxygenation: Pre-oxygenation with 100% oxygen Induction Type: IV induction Ventilation: Mask ventilation without difficulty LMA: LMA inserted LMA Size: 5.0 Number of attempts: 1 Placement Confirmation: positive ETCO2 and breath sounds checked- equal and bilateral Tube secured with: Tape Dental Injury: Teeth and Oropharynx as per pre-operative assessment

## 2019-09-28 NOTE — Anesthesia Postprocedure Evaluation (Signed)
Anesthesia Post Note  Patient: Daniel Norris  Procedure(s) Performed: KNEE ARTHROSCOPY WITH PARTIAL MEDIAL MENISECTOMY (Left Knee)     Patient location during evaluation: PACU Anesthesia Type: General Level of consciousness: awake and alert Pain management: pain level controlled Vital Signs Assessment: post-procedure vital signs reviewed and stable Respiratory status: spontaneous breathing, nonlabored ventilation, respiratory function stable and patient connected to nasal cannula oxygen Cardiovascular status: blood pressure returned to baseline and stable Postop Assessment: no apparent nausea or vomiting Anesthetic complications: no    Last Vitals:  Vitals:   09/28/19 1415 09/28/19 1454  BP: 126/86 (!) 126/98  Pulse: (!) 57 (!) 53  Resp: 14 16  Temp:  (!) 36.4 C  SpO2: 100% 100%    Last Pain:  Vitals:   09/28/19 1445  TempSrc:   PainSc: 0-No pain                 , DAVID

## 2019-09-28 NOTE — Interval H&P Note (Signed)
History and Physical Interval Note:  09/28/2019 12:20 PM  Daniel Norris  has presented today for surgery, with the diagnosis of Left knee medial meniscus tear.  The various methods of treatment have been discussed with the patient and family. After consideration of risks, benefits and other options for treatment, the patient has consented to  Procedure(s) with comments: KNEE ARTHROSCOPY WITH PARTIAL MEDIAL MENISECTOMY (Left) - Femoral nerve block knee block and IV sedation as a surgical intervention.  The patient's history has been reviewed, patient examined, no change in status, stable for surgery.  I have reviewed the patient's chart and labs.  Questions were answered to the patient's satisfaction.     , ANDREW

## 2022-12-10 DIAGNOSIS — Z125 Encounter for screening for malignant neoplasm of prostate: Secondary | ICD-10-CM

## 2022-12-10 HISTORY — DX: Encounter for screening for malignant neoplasm of prostate: Z12.5

## 2022-12-18 DIAGNOSIS — R310 Gross hematuria: Secondary | ICD-10-CM

## 2022-12-18 DIAGNOSIS — N400 Enlarged prostate without lower urinary tract symptoms: Secondary | ICD-10-CM

## 2022-12-18 DIAGNOSIS — N138 Other obstructive and reflux uropathy: Secondary | ICD-10-CM

## 2022-12-18 HISTORY — DX: Benign prostatic hyperplasia without lower urinary tract symptoms: N40.0

## 2022-12-18 HISTORY — DX: Gross hematuria: R31.0

## 2022-12-18 HISTORY — DX: Other obstructive and reflux uropathy: N13.8

## 2023-01-01 ENCOUNTER — Other Ambulatory Visit: Payer: Self-pay | Admitting: Internal Medicine

## 2023-01-01 DIAGNOSIS — Z122 Encounter for screening for malignant neoplasm of respiratory organs: Secondary | ICD-10-CM

## 2023-01-18 ENCOUNTER — Encounter: Payer: Self-pay | Admitting: Internal Medicine

## 2023-01-20 ENCOUNTER — Ambulatory Visit
Admission: RE | Admit: 2023-01-20 | Discharge: 2023-01-20 | Disposition: A | Discharge status: Home / Self Care | Payer: Medicare (Managed Care) | Source: Ambulatory Visit | Attending: Internal Medicine | Admitting: Internal Medicine

## 2023-01-20 DIAGNOSIS — Z122 Encounter for screening for malignant neoplasm of respiratory organs: Secondary | ICD-10-CM

## 2023-02-10 ENCOUNTER — Other Ambulatory Visit (HOSPITAL_COMMUNITY): Payer: Self-pay | Admitting: Urology

## 2023-02-10 DIAGNOSIS — C61 Malignant neoplasm of prostate: Secondary | ICD-10-CM

## 2023-03-05 ENCOUNTER — Other Ambulatory Visit: Payer: Self-pay | Admitting: Internal Medicine

## 2023-03-05 DIAGNOSIS — M545 Low back pain, unspecified: Secondary | ICD-10-CM

## 2023-03-12 ENCOUNTER — Encounter (HOSPITAL_COMMUNITY)
Admission: RE | Admit: 2023-03-12 | Discharge: 2023-03-12 | Disposition: A | Discharge status: Home / Self Care | Payer: Medicare HMO | Source: Ambulatory Visit | Attending: Urology | Admitting: Urology

## 2023-03-12 DIAGNOSIS — C61 Malignant neoplasm of prostate: Secondary | ICD-10-CM | POA: Diagnosis present

## 2023-03-12 MED ORDER — FLOTUFOLASTAT F 18 GALLIUM 296-5846 MBQ/ML IV SOLN
8.7990 | Freq: Once | INTRAVENOUS | Status: AC
  Administered 2023-03-12: 8.799 via INTRAVENOUS

## 2023-03-18 ENCOUNTER — Other Ambulatory Visit: Payer: Self-pay | Admitting: Internal Medicine

## 2023-03-18 DIAGNOSIS — G8929 Other chronic pain: Secondary | ICD-10-CM

## 2023-07-28 ENCOUNTER — Encounter: Payer: Self-pay | Admitting: Internal Medicine

## 2023-07-29 ENCOUNTER — Encounter: Payer: Self-pay | Admitting: Internal Medicine

## 2023-08-07 ENCOUNTER — Ambulatory Visit
Admission: RE | Admit: 2023-08-07 | Discharge: 2023-08-07 | Disposition: A | Discharge status: Home / Self Care | Payer: Medicare HMO | Source: Ambulatory Visit | Attending: Internal Medicine | Admitting: Internal Medicine

## 2023-08-07 ENCOUNTER — Ambulatory Visit: Admission: RE | Admit: 2023-08-07 | Payer: Medicare HMO | Source: Ambulatory Visit

## 2023-08-07 DIAGNOSIS — M545 Low back pain, unspecified: Secondary | ICD-10-CM

## 2023-08-07 MED ORDER — GADOPICLENOL 0.5 MMOL/ML IV SOLN
8.0000 mL | Freq: Once | INTRAVENOUS | Status: AC | PRN
  Administered 2023-08-07: 8 mL via INTRAVENOUS

## 2023-08-10 DIAGNOSIS — R3911 Hesitancy of micturition: Secondary | ICD-10-CM

## 2023-08-10 DIAGNOSIS — R3915 Urgency of urination: Secondary | ICD-10-CM

## 2023-08-10 DIAGNOSIS — R3912 Poor urinary stream: Secondary | ICD-10-CM

## 2023-08-10 DIAGNOSIS — R351 Nocturia: Secondary | ICD-10-CM

## 2023-08-10 HISTORY — DX: Poor urinary stream: R39.12

## 2023-08-10 HISTORY — DX: Urgency of urination: R39.15

## 2023-08-10 HISTORY — DX: Nocturia: R35.1

## 2023-08-10 HISTORY — DX: Hesitancy of micturition: R39.11

## 2023-08-22 ENCOUNTER — Other Ambulatory Visit: Payer: Self-pay

## 2023-08-22 ENCOUNTER — Encounter (HOSPITAL_COMMUNITY): Payer: Self-pay

## 2023-08-22 ENCOUNTER — Emergency Department (HOSPITAL_COMMUNITY)
Admission: EM | Admit: 2023-08-22 | Discharge: 2023-08-22 | Disposition: A | Discharge status: Home / Self Care | Attending: Emergency Medicine | Admitting: Emergency Medicine

## 2023-08-22 DIAGNOSIS — R350 Frequency of micturition: Secondary | ICD-10-CM | POA: Diagnosis present

## 2023-08-22 DIAGNOSIS — N320 Bladder-neck obstruction: Secondary | ICD-10-CM

## 2023-08-22 DIAGNOSIS — R339 Retention of urine, unspecified: Secondary | ICD-10-CM | POA: Diagnosis not present

## 2023-08-22 DIAGNOSIS — Z8546 Personal history of malignant neoplasm of prostate: Secondary | ICD-10-CM | POA: Diagnosis not present

## 2023-08-22 HISTORY — DX: Malignant neoplasm of prostate: C61

## 2023-08-22 LAB — URINALYSIS, ROUTINE W REFLEX MICROSCOPIC
Bacteria, UA: NONE SEEN
Bilirubin Urine: NEGATIVE
Glucose, UA: NEGATIVE mg/dL
Ketones, ur: NEGATIVE mg/dL
Leukocytes,Ua: NEGATIVE
Nitrite: NEGATIVE
Protein, ur: NEGATIVE mg/dL
Specific Gravity, Urine: 1.011 (ref 1.005–1.030)
pH: 7 (ref 5.0–8.0)

## 2023-08-22 MED ORDER — LIDOCAINE HCL URETHRAL/MUCOSAL 2 % EX GEL
1.0000 | Freq: Once | CUTANEOUS | Status: AC
  Administered 2023-08-22: 1 via URETHRAL
  Filled 2023-08-22: qty 11

## 2023-08-22 NOTE — ED Triage Notes (Signed)
 Pt has prostate cancer and states 3 days ago he started to have issues urinating. Pt states he is just able dribble when he urinates and has extreme pain.

## 2023-08-22 NOTE — ED Provider Notes (Signed)
 [place] [facility] AT [facility] Provider Note   CSN: [id number] Arrival date & time: 08/22/23  1844     History  Chief Complaint  Patient presents with   Urinary Frequency    Daniel Norris is a 68 y.o. male with history of prostate cancer, enlarged prostate, BPH, presenting to the ED with concern for difficulty urinating.  Patient ports he had intercourse about 3 nights ago, and thereafter has been having difficulty with urination.  He says his only "a few dribbles at a time".  He says his abdomen feels distended, painful and tight.  HPI     Home Medications Prior to Admission medications   Medication Sig Start Date End Date Taking? Authorizing Provider  amLODipine (NORVASC) 5 MG tablet Take 5 mg by mouth daily.    [provider]  ibuprofen (ADVIL) 200 MG tablet Take 400 mg by mouth every 6 (six) hours as needed.    [provider]  losartan (COZAAR) 100 MG tablet Take 100 mg by mouth daily.    [provider]  methocarbamol (ROBAXIN) 500 MG tablet Take 1 tablet (500 mg total) by mouth 4 (four) times daily. 09/28/19   Cherie Dark, PA  tamsulosin (FLOMAX) 0.4 MG CAPS capsule Take 0.4 mg by mouth daily.    [provider]      Allergies    Patient has no known allergies.    Review of Systems   Review of Systems  Physical Exam Updated Vital Signs BP (!) 146/97 (BP Location: Left Arm)   Pulse 62   Temp 97.8 F (36.6 C) (Oral)   Resp 18   Ht 5' 11.5" (1.816 m)   Wt 88.5 kg   SpO2 98%   BMI 26.82 kg/m  Physical Exam Constitutional:      General: He is not in acute distress. HENT:     Head: Normocephalic and atraumatic.  Eyes:     Conjunctiva/sclera: Conjunctivae normal.     Pupils: Pupils are equal, round, and reactive to light.  Cardiovascular:     Rate and Rhythm: Normal rate and regular rhythm.  Pulmonary:     Effort: Pulmonary effort is normal. No respiratory distress.  Abdominal:      General: There is no distension.     Tenderness: There is abdominal tenderness in the suprapubic area.  Skin:    General: Skin is warm and dry.  Neurological:     General: No focal deficit present.     Mental Status: He is alert. Mental status is at baseline.  Psychiatric:        Mood and Affect: Mood normal.        Behavior: Behavior normal.     ED Results / Procedures / Treatments   Labs (all labs ordered are listed, but only abnormal results are displayed) Labs Reviewed  URINALYSIS, ROUTINE W REFLEX MICROSCOPIC - Abnormal; Notable for the following components:      Result Value   Hgb urine dipstick MODERATE (*)    All other components within normal limits    EKG None  Radiology No results found.  Procedures Procedures    Medications Ordered in ED Medications  lidocaine (XYLOCAINE) 2 % jelly 1 Application (1 Application Urethral Given 08/22/23 1945)    ED Course/ Medical Decision Making/ A&P Clinical Course as of 08/22/23 2233  Wynelle Link Aug 22, 2023  2107 Patient had significant relief of suprapubic discomfort with Foley catheter placement with clear urine drained.  There is no clear evidence of infection on the UA.  We will leave the Foley catheter in place and have him follow-up with his urologist in 1 to 2 weeks for void trial. [MT]    Clinical Course User Index [MT] Asya Derryberry, Kermit Balo, MD                                 Medical Decision Making Amount and/or Complexity of Data Reviewed Labs: ordered.   Patient is presenting with suspected bladder outlet obstruction, likely given to enlarged prostate.  He has discomfort and a distended bladder on exam.  I requested nursing to place Foley catheter or coud to relieve his bladder, and we will check a UA.  UA reviewed.  There is some hemoglobin but no evidence of infection.  The patient reported complete relief of his pain after the Foley was placed which drained urine from the bladder.  He is stable to go home with a  Foley in place.  He is not needing antibiotics.  He already has urologist and will call tomorrow morning to schedule follow-up appointment for a void trial.  He was educated about Foley bag emptying and maintenance at home.  Nurse will recheck blood pressure which is elevated likely due to pain or discomfort on arrival.        Final Clinical Impression(s) / ED Diagnoses Final diagnoses:  Bladder outlet obstruction  Urinary retention    Rx / DC Orders ED Discharge Orders     None         Terald Sleeper, MD 08/22/23 2233

## 2023-08-22 NOTE — ED Notes (Signed)
 Foley catheter placed, pt reports significant relief following.

## 2023-08-22 NOTE — Discharge Instructions (Addendum)
 Please call the urology office tomorrow morning to schedule follow-up appoint with urologist in 1 to 2 weeks.  At that time they will likely try to remove the catheter and ensure that you can urinate normally in the office.  In the meantime, make sure that you empty your bag regularly before it gets full.  Your Foley catheter should not stay in for more than 1 month without being exchanged for clean catheter.  Leaving it in longer than 1 month can put you at risk of infection.  At this time, in the emergency room, your urine sample did not show signs of an infection.

## 2023-08-30 DIAGNOSIS — R339 Retention of urine, unspecified: Secondary | ICD-10-CM

## 2023-08-30 DIAGNOSIS — C61 Malignant neoplasm of prostate: Secondary | ICD-10-CM

## 2023-08-30 HISTORY — DX: Retention of urine, unspecified: R33.9

## 2023-08-30 HISTORY — DX: Malignant neoplasm of prostate: C61

## 2023-09-15 ENCOUNTER — Encounter: Payer: Self-pay | Admitting: Radiation Oncology

## 2023-09-15 NOTE — Progress Notes (Signed)
 GU Location of Tumor / Histology: Prostate Ca  If Prostate Cancer, Gleason Score is (4 + 5) and PSA is (8.82 on 08/31/2023)  PSA 90.6 on 08/11/2023 PSA 66.0 on 12/11/2022 PSA 52.02 on 06/11/2022  Daniel Norris presented as referral from Dr. Modena Slater [facility]) elevated PSA.  Biopsies      08/07/2023 Dr. Jamison Oka MR Lumbar Spine with/without Contrast CLINICAL DATA:  Recent prostate cancer diagnosis. Low back pain.  Question of potential bladder cancer.   IMPRESSION: 1. No evidence of regional metastatic disease. 2. L3-4: Shallow protrusion of the disc with slight up turning behind the inferior endplate of L3. No compressive stenosis. Mild facet and ligamentous hypertrophy. 3. L4-5: Endplate osteophytes and bulging of the disc more prominent towards the right. Mild narrowing of the right lateral recess and intervertebral foramen on the right but no likely neural compression. Mild facet osteoarthritis. 4. L5-S1: Disc degeneration with loss of disc height. Endplate osteophytes and bulging of the disc. Mild facet hypertrophy. No compressive stenosis. Minimal discogenic endplate changes which could contribute to back pain. 5. The no advanced disease is identified, the findings in general could relate to regional pain.   03/12/2023 Dr. Modena Slater NM PET (PSMA) Skull to Mid Thigh CLINICAL DATA: Prostate carcinoma staging.   IMPRESSION: 1. Radiotracer avid foci in the prostate gland compatible with primary prostate carcinoma. 2. Radiotracer avid iliac side chain, pelvic sidewall and mesorectal lymph nodes compatible with nodal metastatic disease. 3. Radiotracer avid osseous foci in the right ischium and left acetabulum compatible with osseous metastatic disease. 4. Focus of FDG avid soft tissue in the right cheek is compatible with asymmetric glandular tissue. Recommend correlation with physical examination. 5.  Aortic Atherosclerosis  (ICD10-I70.0).   Past/Anticipated interventions by urology, if any:     Past/Anticipated interventions by medical oncology, if any: NA  Weight changes, if any: No  IPSS:  4 SHIM:  25  Bowel/Bladder complaints, if any:  No  Nausea/Vomiting, if any:No  Pain issues, if any:  0/10  SAFETY ISSUES: Prior radiation? No Pacemaker/ICD? No Possible current pregnancy? Male Is the patient on methotrexate? No  Current Complaints / other details:

## 2023-09-23 NOTE — Progress Notes (Signed)
 Radiation Oncology         [phone] ________________________________  Initial Outpatient Consultation  Name: Daniel Norris MRN: [id number]  Date: 09/24/2023  DOB: [birth year]  CC:Harvest [place], MD  Crista Elliot, MD   REFERRING PHYSICIAN: Crista Elliot, MD  DIAGNOSIS: 68 y.o. 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    ICD-10-CM   1. Malignant neoplasm of prostate (HCC)  C61       HISTORY OF PRESENT ILLNESS: Daniel Norris is a 68 y.o. male with a diagnosis of prostate cancer. He was initially referred to Dr. Alvester Morin back in 2019 for an elevated PSA of 4.53. He was found to have a prostate nodule on digital rectal exam, and prostate biopsy was recommended. He declined biopsy and was subsequently lost to follow up. Since that time, his PSA has continued to rise, and he was noted to have an elevated PSA of 49.52 by his primary care physician, Dr. Corky Downs.  Accordingly, he was referred back to urology for further evaluation and saw Dr. Alvester Morin on 12/10/22. A repeat PSA obtained at that time showed a further rise to 66 and digital rectal exam showed bilateral firmness throughout. Though reluctant, the patient agreed to proceed to transrectal ultrasound with 12 biopsies of the prostate on 01/01/23.  The prostate volume measured 87.81 cc.  Out of 12 core biopsies, all 12 were positive.  The maximum Gleason score was 4+5, and this was seen in the left apex lateral and left base (with perineural invasion and extraprostatic extension). Additionally, Gleason 4+4 was seen in the remaining four left-sided cores and in the right base, Gleason 4+3 in the right mid, right apex, and right base lateral, and Gleason 3+4 in the right mid lateral and right apex lateral.   At time of diagnosis, initiation of treatment with at least ADT was highly recommended, but the patient refused. He did proceed with PSMA PET scan on 03/12/23  showing radiotracer-avid foci in the prostate gland with radiotracer-avid iliac side chain, pelvic sidewall, and mesorectal lymph nodes and radiotracer-avid osseous foci in the right ischium and left acetabulum.  At that time, he did not feel ready to start treatment and chose to take some additional time to consider his options.  He returned to urology for follow up in 07/2023, and a repeat PSA was up to 90.6. He agreed to initiate ADT with Firmagon injections at the time of his follow-up visit with Bartholomew Crews, NP on 08/10/23. He had reported worsening LUTS and ultimately developed acute urinary retention requiring Foley catheter placement during an ED visit on 08/22/23.  He had a follow-up visit with Dr. Alvester Morin on 08/30/23 for voiding trial which was successful and the Foley catheter was removed. His PSA had decreased to 8.82, indicating a good response to ADT.  He was given a 100-month Eligard injection and started on Erleada at follow up on 09/09/23.  The patient reviewed the biopsy, imaging and lab results with his urologist and he has kindly been referred today for discussion of potential radiation treatment options.   PREVIOUS RADIATION THERAPY: No  PAST MEDICAL HISTORY:  Past Medical History:  Diagnosis Date   Acute meniscal tear of left knee    Anxiety 2014   Arthritis    Borderline diabetes    followed by pcp---  watches diet   BPH (benign prostatic hyperplasia) 12/18/2022   Depression    Elevated  PSA    GERD (gastroesophageal reflux disease)    09-25-2019 per pt watches diet   Gross hematuria 12/18/2022   12/10/2022   Hypercholesterolemia    Hypertension    followed by pcp   (09-25-2019  per pt has never had a stress test)   Nocturia 08/10/2023   12/10/2022, 2019   Prostate cancer (HCC) 08/30/2023   08/10/2023, 04/09/2023, 01/20/2023   Prostate cancer screening 12/10/2022   Prostate nodule with urinary obstruction 12/18/2022   12/10/2022, 2019   Urinary hesitancy 08/10/2023    12/18/2022, 12/10/2022, 2019   Urinary retention 08/30/2023   2019   Urinary urgency 08/10/2023   12/10/2022   Weak urinary stream 08/10/2023   12/18/2022, 12/10/2022   Wears glasses       PAST SURGICAL HISTORY: Past Surgical History:  Procedure Laterality Date   INGUINAL HERNIA REPAIR Right 08-12-2010  @ [facility]   KNEE ARTHROSCOPY WITH MEDIAL MENISECTOMY Left 09/28/2019   Procedure: KNEE ARTHROSCOPY WITH PARTIAL MEDIAL MENISECTOMY;  Surgeon: Eugenia Mcalpine, MD;  Location: [facility];  Service: Orthopedics;  Laterality: Left;   PROSTATE BIOPSY      FAMILY HISTORY:  Family History  Problem Relation Age of Onset   Kidney disease Mother        kidney failure    Bipolar disorder Father    Nephrolithiasis Brother     SOCIAL HISTORY:  Social History   Socioeconomic History   Marital status: Married    Spouse name: Not on file   Number of children: Not on file   Years of education: Not on file   Highest education level: Not on file  Occupational History   Not on file  Tobacco Use   Smoking status: Former    Current packs/day: 0.00    Average packs/day: 0.5 packs/day for 30.0 years (15.0 ttl pk-yrs)    Types: Cigarettes    Start date: 02/24/1989    Quit date: 02/25/2019    Years since quitting: 4.5   Smokeless tobacco: Never  Vaping Use   Vaping status: Former  Substance and Sexual Activity   Alcohol use: Not Currently   Drug use: No   Sexual activity: Not on file  Other Topics Concern   Not on file  Social History Narrative   Not on file   Social Drivers of Health   Financial Resource Strain: Not on file  Food Insecurity: No Food Insecurity (09/24/2023)   Hunger Vital Sign    Worried About Running Out of Food in the Last Year: Never true    Ran Out of Food in the Last Year: Never true  Transportation Needs: No Transportation Needs (09/24/2023)   PRAPARE - Administrator, Civil Service (Medical): No    Lack of Transportation (Non-Medical): No   Physical Activity: Not on file  Stress: Not on file  Social Connections: Not on file  Intimate Partner Violence: Not At Risk (09/24/2023)   Humiliation, Afraid, Rape, and Kick questionnaire    Fear of Current or Ex-Partner: No    Emotionally Abused: No    Physically Abused: No    Sexually Abused: No    ALLERGIES: Patient has no known allergies.  MEDICATIONS:  Current Outpatient Medications  Medication Sig Dispense Refill   calcium-vitamin D (OSCAL WITH D) 500-5 MG-MCG tablet Take 1 tablet by mouth daily.     losartan (COZAAR) 100 MG tablet Take 100 mg by mouth daily.     rosuvastatin (CRESTOR) 10 MG tablet Take  10 mg by mouth at bedtime.     saw palmetto 160 MG capsule Take 160 mg by mouth 2 (two) times daily.     tamsulosin (FLOMAX) 0.4 MG CAPS capsule Take 0.4 mg by mouth daily.     Turmeric (QC TUMERIC COMPLEX PO) Take by mouth.     omeprazole (PRILOSEC) 40 MG capsule Take 1 capsule by mouth as needed.     No current facility-administered medications for this encounter.    REVIEW OF SYSTEMS:  On review of systems, the patient reports that he is doing well overall. He denies any chest pain, shortness of breath, cough, fevers, chills, night sweats, unintended weight changes. He denies any bowel disturbances, and denies abdominal pain, nausea or vomiting. He denies any new musculoskeletal or joint aches or pains. His IPSS was 4, indicating minimal urinary symptoms since having the Foley catheter removed.  He has continued taking the Flomax daily as prescribed and reports a good flow of stream and feels like he is emptying his bladder well at this point.  His SHIM was 25, indicating he does not have erectile dysfunction. A complete review of systems is obtained and is otherwise negative.    PHYSICAL EXAM:  Wt Readings from Last 3 Encounters:  09/24/23 194 lb 9.6 oz (88.3 kg)  08/22/23 195 lb (88.5 kg)  09/28/19 177 lb 9.6 oz (80.6 kg)   Temp Readings from Last 3 Encounters:   09/24/23 97.9 F (36.6 C)  08/22/23 97.8 F (36.6 C) (Oral)  09/28/19 (!) 97.5 F (36.4 C)   BP Readings from Last 3 Encounters:  09/24/23 (!) 132/104  08/22/23 (!) 146/97  09/28/19 (!) 126/98   Pulse Readings from Last 3 Encounters:  09/24/23 63  08/22/23 62  09/28/19 (!) 53    /10  In general this is a well appearing African-American male in no acute distress. He's alert and oriented x4 and appropriate throughout the examination. Cardiopulmonary assessment is negative for acute distress, and he exhibits normal effort.     KPS = 100  100 - Normal; no complaints; no evidence of disease. 90   - Able to carry on normal activity; minor signs or symptoms of disease. 80   - Normal activity with effort; some signs or symptoms of disease. 26   - Cares for self; unable to carry on normal activity or to do active work. 60   - Requires occasional assistance, but is able to care for most of his personal needs. 50   - Requires considerable assistance and frequent medical care. 40   - Disabled; requires special care and assistance. 30   - Severely disabled; hospital admission is indicated although death not imminent. 20   - Very sick; hospital admission necessary; active supportive treatment necessary. 10   - Moribund; fatal processes progressing rapidly. 0     - Dead  Karnofsky DA, Abelmann WH, Craver LS and Burchenal [facility] [phone]) The use of the nitrogen mustards in the palliative treatment of carcinoma: with particular reference to bronchogenic carcinoma Cancer 1 634-56  LABORATORY DATA:  Lab Results  Component Value Date   WBC 6.2 06/09/2016   HGB 16.0 09/28/2019   HCT 47.0 09/28/2019   MCV 90.6 06/09/2016   PLT 155 06/09/2016   Lab Results  Component Value Date   NA 141 09/28/2019   K 5.0 09/28/2019   CL 106 09/28/2019   CO2 24 06/09/2016   Lab Results  Component Value Date   ALT 20 06/09/2016  AST 22 06/09/2016   ALKPHOS 69 06/09/2016   BILITOT 1.0 06/09/2016      RADIOGRAPHY: No results found.    IMPRESSION/PLAN: 1. 68 y.o. 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 We discussed the patient's workup and outlined the nature of prostate cancer in this setting. The patient's T stage, PSA and Gleason score put him into the high risk disease category and PSMA scan from 02/2023 confirmed oligometastatic disease involving pelvic lymphadenopathy and 2 focal sites of osseous metastatic disease. Accordingly, he is eligible for systemic therapy alone as more of a palliative approach at managing his prostate cancer versus LT-ADT concurrent with 8 weeks of daily external radiation to the prostate, pelvic lymph nodes and bony metastases in the pelvis with a boost to the prostate, PET positive lymph nodes and bony metastases, concurrent with LT-ADT, for definitive treatment. We discussed the available radiation techniques, and focused on the details and logistics of delivery. The patient is not an ideal candidate for brachytherapy boost with a prostate volume of 88 cc, as well as his recent urinary retention.  Therefore, we discussed and outlined the risks, benefits, short and long-term effects associated with daily external beam radiotherapy and compared and contrasted these with prostatectomy. We discussed the role of SpaceOAR gel in reducing the rectal toxicity associated with radiotherapy.  He understands that while definitive treatment gives him the best chance for cure, albeit 25-30% likelihood, it does carry a very high likelihood for more durable disease control long-term.  He was encouraged to ask questions that were answered to his stated satisfaction.  At the conclusion of our conversation, the patient would like to take some additional time to consider the treatment options but is leaning towards moving forward with the recommended 8-week course of daily external radiation to the prostate, pelvic  lymph nodes and bony metastases in the pelvis with a boost to the prostate, PET positive lymph nodes and bony metastases, concurrent with LT-ADT.  He has already started ADT on 08/10/2023 so we will share our discussion with Dr. Alvester Morin and plan for a follow-up visit in 4 to 6 weeks to answer any additional questions that he might have regarding treatment and proceed with treatment planning at that time if he is in agreement.  We enjoyed meeting him and his wife, Dondra Spry, today and look forward to continuing to participate in his care.  We personally spent 70 minutes in this encounter including chart review, reviewing radiological studies, meeting face-to-face with the patient, entering orders and completing documentation.    Marguarite Arbour, PA-C    Margaretmary Dys, MD  [facility]  Radiation Oncology Direct Dial: [phone]  Fax: [phone] [email or web address]  Skype  LinkedIn   This document serves as a record of services personally performed by Margaretmary Dys, MD and Marcello Fennel, PA-C. It was created on their behalf by Mickie Bail, a trained medical scribe. The creation of this record is based on the scribe's personal observations and the provider's statements to them. This document has been checked and approved by the attending provider.

## 2023-09-24 ENCOUNTER — Ambulatory Visit
Admission: RE | Admit: 2023-09-24 | Discharge: 2023-09-24 | Disposition: A | Discharge status: Home / Self Care | Source: Ambulatory Visit | Attending: Radiation Oncology | Admitting: Radiation Oncology

## 2023-09-24 ENCOUNTER — Encounter: Payer: Self-pay | Admitting: Urology

## 2023-09-24 ENCOUNTER — Encounter: Payer: Self-pay | Admitting: Radiation Oncology

## 2023-09-24 ENCOUNTER — Other Ambulatory Visit: Payer: Self-pay

## 2023-09-24 VITALS — BP 132/104 | HR 63 | Temp 97.9°F | Resp 18 | Ht 71.0 in | Wt 194.6 lb

## 2023-09-24 DIAGNOSIS — I10 Essential (primary) hypertension: Secondary | ICD-10-CM | POA: Diagnosis not present

## 2023-09-24 DIAGNOSIS — E7800 Pure hypercholesterolemia, unspecified: Secondary | ICD-10-CM | POA: Insufficient documentation

## 2023-09-24 DIAGNOSIS — Z79899 Other long term (current) drug therapy: Secondary | ICD-10-CM | POA: Diagnosis not present

## 2023-09-24 DIAGNOSIS — Z87891 Personal history of nicotine dependence: Secondary | ICD-10-CM | POA: Insufficient documentation

## 2023-09-24 DIAGNOSIS — N401 Enlarged prostate with lower urinary tract symptoms: Secondary | ICD-10-CM | POA: Insufficient documentation

## 2023-09-24 DIAGNOSIS — C61 Malignant neoplasm of prostate: Secondary | ICD-10-CM | POA: Insufficient documentation

## 2023-09-24 DIAGNOSIS — R339 Retention of urine, unspecified: Secondary | ICD-10-CM | POA: Insufficient documentation

## 2023-09-24 HISTORY — DX: Elevated prostate specific antigen (PSA): R97.20

## 2023-09-24 HISTORY — DX: Unspecified osteoarthritis, unspecified site: M19.90

## 2023-09-24 HISTORY — DX: Depression, unspecified: F32.A

## 2023-09-24 HISTORY — DX: Pure hypercholesterolemia, unspecified: E78.00

## 2023-09-27 NOTE — Progress Notes (Signed)
 Introduced myself to the patient as the prostate nurse navigator.  No barriers to care identified at this time.  He is here to discuss his radiation treatment options and will take some additional time to consider his options and is agreeable for me to follow up.  I gave him my business card and asked him to call me with questions or concerns.  Verbalized understanding.

## 2023-09-29 ENCOUNTER — Encounter: Payer: Self-pay | Admitting: Family Medicine

## 2023-10-06 ENCOUNTER — Other Ambulatory Visit: Payer: Self-pay | Admitting: Family Medicine

## 2023-10-06 ENCOUNTER — Encounter: Payer: Self-pay | Admitting: Family Medicine

## 2023-10-06 DIAGNOSIS — R10819 Abdominal tenderness, unspecified site: Secondary | ICD-10-CM

## 2023-10-14 ENCOUNTER — Telehealth: Payer: Self-pay | Admitting: Radiation Oncology

## 2023-10-14 NOTE — Telephone Encounter (Signed)
 LVM to schedule FUN with PA Bruning to rediscuss tx option.

## 2023-10-14 NOTE — Progress Notes (Signed)
 RN placed request for patient to be scheduled for a follow up to re-discuss treatment recommendations per LOS.

## 2023-10-19 NOTE — Progress Notes (Signed)
 Patient is now scheduled for a follow up with Ashlyn on 5/15.   Will continue to follow.

## 2023-11-02 NOTE — Progress Notes (Signed)
 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  Patient identity verified x2.   Patient states issues as follows...  -Pain: 7/10 Lower back / ROM: Mildly limited -Fatigue: Moderate -Abdomen: Denies -Groin: Denies -Urinary: Polyuria/ Nocturia x5 +. -Bowels: Denies -Appetite: Good  Patient denies all other related issues at this time.  Meaningful use complete.  I-PSS (AUA) score- 13 - Moderate SHIM (ED) score- 5- sexually active Urinary Management medication(s) Tamsulosin  Urology appointment date- 12/15/2023 with Dr. Parke Boll at [name]  Vitals- BP (!) 140/92 (BP Location: Left Arm, Patient Position: Sitting, Cuff Size: Normal)   Pulse 69   Temp (!) 96.9 F (36.1 C) (Temporal)   Resp 18   Ht 5\' 11"  (1.803 m)   Wt 195 lb 4 oz (88.6 kg)   SpO2 100%   BMI 27.23 kg/m    Additional info...  Urologist: Dr. Leila Punt  Recent PSA 09/09/2023 decrease to 8.82 post ADT tx w/ Firmagon injections.  Eligard injections every 6 months  Began Erleada on 09/09/2023  PET SCAN 03/12/2023: IMPRESSION: 1. Radiotracer avid foci in the prostate gland compatible with primary prostate carcinoma. 2. Radiotracer avid iliac side chain, pelvic sidewall and mesorectal lymph nodes compatible with nodal metastatic disease. 3. Radiotracer avid osseous foci in the right ischium and left acetabulum compatible with osseous metastatic disease. 4. Focus of FDG avid soft tissue in the right cheek is compatible with asymmetric glandular tissue. Recommend correlation with physical examination. 5.  Aortic Atherosclerosis (ICD10-I70.0).   MRI SPINE 06/05/2024: IMPRESSION: 1. No evidence of regional metastatic disease. 2. L3-4: Shallow protrusion of the disc with slight up turning behind the inferior endplate of L3. No compressive  stenosis. Mild facet and ligamentous hypertrophy. 3. L4-5: Endplate osteophytes and bulging of the disc more prominent towards the right. Mild narrowing of the right lateral recess and intervertebral foramen on the right but no likely neural compression. Mild facet osteoarthritis. 4. L5-S1: Disc degeneration with loss of disc height. Endplate osteophytes and bulging of the disc. Mild facet hypertrophy. No compressive stenosis. Minimal discogenic endplate changes which could contribute to back pain. 5. The no advanced disease is identified, the findings in general could relate to regional pain.   This concludes the interaction.   Avery Bodo, LPN

## 2023-11-04 ENCOUNTER — Encounter: Payer: Self-pay | Admitting: Urology

## 2023-11-04 ENCOUNTER — Ambulatory Visit
Admission: RE | Admit: 2023-11-04 | Discharge: 2023-11-04 | Disposition: A | Discharge status: Home / Self Care | Source: Ambulatory Visit | Attending: Urology | Admitting: Urology

## 2023-11-04 VITALS — BP 140/92 | HR 69 | Temp 96.9°F | Resp 18 | Ht 71.0 in | Wt 195.2 lb

## 2023-11-04 DIAGNOSIS — R339 Retention of urine, unspecified: Secondary | ICD-10-CM | POA: Insufficient documentation

## 2023-11-04 DIAGNOSIS — C775 Secondary and unspecified malignant neoplasm of intrapelvic lymph nodes: Secondary | ICD-10-CM | POA: Diagnosis not present

## 2023-11-04 DIAGNOSIS — Z87891 Personal history of nicotine dependence: Secondary | ICD-10-CM | POA: Insufficient documentation

## 2023-11-04 DIAGNOSIS — I10 Essential (primary) hypertension: Secondary | ICD-10-CM | POA: Diagnosis not present

## 2023-11-04 DIAGNOSIS — C7951 Secondary malignant neoplasm of bone: Secondary | ICD-10-CM | POA: Diagnosis not present

## 2023-11-04 DIAGNOSIS — K219 Gastro-esophageal reflux disease without esophagitis: Secondary | ICD-10-CM | POA: Diagnosis not present

## 2023-11-04 DIAGNOSIS — C61 Malignant neoplasm of prostate: Secondary | ICD-10-CM | POA: Insufficient documentation

## 2023-11-04 DIAGNOSIS — M129 Arthropathy, unspecified: Secondary | ICD-10-CM | POA: Insufficient documentation

## 2023-11-04 DIAGNOSIS — D492 Neoplasm of unspecified behavior of bone, soft tissue, and skin: Secondary | ICD-10-CM

## 2023-11-04 DIAGNOSIS — I951 Orthostatic hypotension: Secondary | ICD-10-CM | POA: Insufficient documentation

## 2023-11-04 DIAGNOSIS — E7800 Pure hypercholesterolemia, unspecified: Secondary | ICD-10-CM | POA: Insufficient documentation

## 2023-11-04 DIAGNOSIS — Z79899 Other long term (current) drug therapy: Secondary | ICD-10-CM | POA: Insufficient documentation

## 2023-11-04 NOTE — Progress Notes (Signed)
 Radiation Oncology         (336) [phone] ________________________________  Initial Outpatient Consultation  Name: Daniel Norris MRN: [id number]  Date: 11/04/2023  DOB: [birth year]  CC:Daniel Batters, MD  Daniel Croak, MD   REFERRING PHYSICIAN: Samson Croak, MD  DIAGNOSIS: 68 y.o. 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    ICD-10-CM   1. Malignant neoplasm of prostate (HCC)  C61     2. 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 male with a diagnosis of prostate cancer. He was initially referred to Dr. Parke Norris back in 2019 for an elevated PSA of 4.53. He was found to have a prostate nodule on digital rectal exam, and prostate biopsy was recommended. He declined biopsy and was subsequently lost to follow up. Since that time, his PSA has continued to rise, and he was noted to have an elevated PSA of 49.52 by his primary care physician, Dr. Wilene Norris.  Accordingly, he was referred back to urology for further evaluation and saw Dr. Parke Norris on 12/10/22. A repeat PSA obtained at that time showed a further rise to 66 and digital rectal exam showed bilateral firmness throughout. Though reluctant, the patient agreed to proceed to transrectal ultrasound with 12 biopsies of the prostate on 01/01/23.  The prostate volume measured 87.81 cc.  Out of 12 core biopsies, all 12 were positive.  The maximum Gleason score was 4+5, and this was seen in the left apex lateral and left base (with perineural invasion and extraprostatic extension). Additionally, Gleason 4+4 was seen in the remaining four left-sided cores and in the right base, Gleason 4+3 in the right mid, right apex, and right base lateral, and Gleason 3+4 in the right mid lateral and right apex lateral.   At time of diagnosis, initiation of treatment with at least ADT was highly recommended, but the patient refused. He did  proceed with PSMA PET scan on 03/12/23 showing radiotracer-avid foci in the prostate gland with radiotracer-avid iliac side chain, pelvic sidewall, and mesorectal lymph nodes and radiotracer-avid osseous foci in the right ischium and left acetabulum.  At that time, he did not feel ready to start treatment and chose to take some additional time to consider his options.  He returned to urology for follow up in 07/2023, and a repeat PSA was up to 90.6. He agreed to initiate ADT with Firmagon injections at the time of his follow-up visit with Daniel Caldron, NP on 08/10/23. He had reported worsening LUTS and ultimately developed acute urinary retention requiring Foley catheter placement during an ED visit on 08/22/23.  He had a follow-up visit with Dr. Parke Norris on 08/30/23 for voiding trial which was successful and the Foley catheter was removed. His PSA had decreased to 8.82, indicating a good response to ADT.  He was given a 37-month Eligard injection and started on Erleada at follow up on 09/09/23.  We met with the patient on 09/24/23, to discuss potential radiation treatment options.  The recommendation was to proceed with an 8-week course of daily external radiation to the prostate, pelvic lymph nodes and bony metastases in the pelvis with a boost to the prostate, PET positive lymph nodes and bony metastases, concurrent with LT-ADT.  He was not quite ready to commit to treatment at that time but has since spoken with several friends/family and done  some additional reading and research and is now ready to proceed.  He is here today to review the recommended treatment and answer any additional questions that he has.   PREVIOUS RADIATION THERAPY: No  PAST MEDICAL HISTORY:  Past Medical History:  Diagnosis Date   Acute meniscal tear of left knee    Anxiety 2014   Arthritis    Borderline diabetes    followed by pcp---  watches diet   BPH (benign prostatic hyperplasia) 12/18/2022   Depression    Elevated PSA    GERD  (gastroesophageal reflux disease)    09-25-2019 per pt watches diet   Gross hematuria 12/18/2022   12/10/2022   Hypercholesterolemia    Hypertension    followed by pcp   (09-25-2019  per pt has never had a stress test)   Nocturia 08/10/2023   12/10/2022, 2019   Prostate cancer (HCC) 08/30/2023   08/10/2023, 04/09/2023, 01/20/2023   Prostate cancer screening 12/10/2022   Prostate nodule with urinary obstruction 12/18/2022   12/10/2022, 2019   Urinary hesitancy 08/10/2023   12/18/2022, 12/10/2022, 2019   Urinary retention 08/30/2023   2019   Urinary urgency 08/10/2023   12/10/2022   Weak urinary stream 08/10/2023   12/18/2022, 12/10/2022   Wears glasses       PAST SURGICAL HISTORY: Past Surgical History:  Procedure Laterality Date   INGUINAL HERNIA REPAIR Right 08-12-2010  @ [facility]   KNEE ARTHROSCOPY WITH MEDIAL MENISECTOMY Left 09/28/2019   Procedure: KNEE ARTHROSCOPY WITH PARTIAL MEDIAL MENISECTOMY;  Surgeon: Genevie Kerns, MD;  Location: [facility];  Service: Orthopedics;  Laterality: Left;   PROSTATE BIOPSY      FAMILY HISTORY:  Family History  Problem Relation Age of Onset   Kidney disease Mother        kidney failure    Bipolar disorder Father    Nephrolithiasis Brother     SOCIAL HISTORY:  Social History   Socioeconomic History   Marital status: Married    Spouse name: Not on file   Number of children: Not on file   Years of education: Not on file   Highest education level: Not on file  Occupational History   Not on file  Tobacco Use   Smoking status: Former    Current packs/day: 0.00    Average packs/day: 0.5 packs/day for 30.0 years (15.0 ttl pk-yrs)    Types: Cigarettes    Start date: 02/24/1989    Quit date: 02/25/2019    Years since quitting: 4.6   Smokeless tobacco: Never  Vaping Use   Vaping status: Former  Substance and Sexual Activity   Alcohol use: Not Currently   Drug use: No   Sexual activity: Not on file  Other Topics Concern    Not on file  Social History Narrative   Not on file   Social Drivers of Health   Financial Resource Strain: Not on file  Food Insecurity: No Food Insecurity (11/04/2023)   Hunger Vital Sign    Worried About Running Out of Food in the Last Year: Never true    Ran Out of Food in the Last Year: Never true  Transportation Needs: No Transportation Needs (11/04/2023)   PRAPARE - Administrator, Civil Service (Medical): No    Lack of Transportation (Non-Medical): No  Physical Activity: Not on file  Stress: Not on file  Social Connections: Not on file  Intimate Partner Violence: Not At Risk (11/04/2023)   Humiliation, Afraid, Rape, and  Kick questionnaire    Fear of Current or Ex-Partner: No    Emotionally Abused: No    Physically Abused: No    Sexually Abused: No    ALLERGIES: Patient has no known allergies.  MEDICATIONS:  Current Outpatient Medications  Medication Sig Dispense Refill   calcium-vitamin D (OSCAL WITH D) 500-5 MG-MCG tablet Take 1 tablet by mouth daily.     losartan (COZAAR) 100 MG tablet Take 100 mg by mouth daily.     omeprazole (PRILOSEC) 40 MG capsule Take 1 capsule by mouth as needed.     rosuvastatin (CRESTOR) 10 MG tablet Take 10 mg by mouth at bedtime.     saw palmetto 160 MG capsule Take 160 mg by mouth 2 (two) times daily.     tamsulosin  (FLOMAX ) 0.4 MG CAPS capsule Take 0.4 mg by mouth daily.     Turmeric (QC TUMERIC COMPLEX PO) Take by mouth.     No current facility-administered medications for this encounter.    REVIEW OF SYSTEMS:  On review of systems, the patient reports that he is doing well overall. He denies any chest pain, shortness of breath, cough, fevers, chills, night sweats, unintended weight changes. He denies any bowel disturbances, and denies abdominal pain, nausea or vomiting. He denies any new musculoskeletal or joint aches or pains. His IPSS was 13, indicating moderate urinary symptoms with nocturia x 5 and daytime frequency.  He  has continued taking the Flomax  daily as prescribed and reports a good flow of stream and feels like he is emptying his bladder well at this point.  He has noted some orthostatic hypotension since starting the Flomax  medication.  His SHIM was 5, secondary to low libido associated with the ADT. A complete review of systems is obtained and is otherwise negative.    PHYSICAL EXAM:  Wt Readings from Last 3 Encounters:  11/04/23 195 lb 4 oz (88.6 kg)  09/24/23 194 lb 9.6 oz (88.3 kg)  08/22/23 195 lb (88.5 kg)   Temp Readings from Last 3 Encounters:  11/04/23 (!) 96.9 F (36.1 C) (Temporal)  09/24/23 97.9 F (36.6 C)  08/22/23 97.8 F (36.6 C) (Oral)   BP Readings from Last 3 Encounters:  11/04/23 (!) 140/92  09/24/23 (!) 132/104  08/22/23 (!) 146/97   Pulse Readings from Last 3 Encounters:  11/04/23 69  09/24/23 63  08/22/23 62   Pain Assessment Pain Score: 7  Pain Loc: Back (Lower back)/10  In general this is a well appearing African-American male in no acute distress. He's alert and oriented x4 and appropriate throughout the examination. Cardiopulmonary assessment is negative for acute distress, and he exhibits normal effort.     KPS = 100  100 - Normal; no complaints; no evidence of disease. 90   - Able to carry on normal activity; minor signs or symptoms of disease. 80   - Normal activity with effort; some signs or symptoms of disease. 62   - Cares for self; unable to carry on normal activity or to do active work. 60   - Requires occasional assistance, but is able to care for most of his personal needs. 50   - Requires considerable assistance and frequent medical care. 40   - Disabled; requires special care and assistance. 30   - Severely disabled; hospital admission is indicated although death not imminent. 20   - Very sick; hospital admission necessary; active supportive treatment necessary. 10   - Moribund; fatal processes progressing rapidly. 0     -  Dead  Karnofsky DA, Abelmann WH, Craver LS and Burchenal [facility] [phone]) The use of the nitrogen mustards in the palliative treatment of carcinoma: with particular reference to bronchogenic carcinoma Cancer 1 634-56  LABORATORY DATA:  Lab Results  Component Value Date   WBC 6.2 06/09/2016   HGB 16.0 09/28/2019   HCT 47.0 09/28/2019   MCV 90.6 06/09/2016   PLT 155 06/09/2016   Lab Results  Component Value Date   NA 141 09/28/2019   K 5.0 09/28/2019   CL 106 09/28/2019   CO2 24 06/09/2016   Lab Results  Component Value Date   ALT 20 06/09/2016   AST 22 06/09/2016   ALKPHOS 69 06/09/2016   BILITOT 1.0 06/09/2016     RADIOGRAPHY: No results found.    IMPRESSION/PLAN: 1. 68 y.o. 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 We discussed the patient's workup and reviewed the nature of prostate cancer in this setting. The patient's T stage, PSA and Gleason score put him into the high risk disease category and PSMA scan from 02/2023 confirmed oligometastatic disease involving pelvic lymphadenopathy and 2 focal sites of osseous metastatic disease. Accordingly, he is eligible for systemic therapy alone as more of a palliative approach at managing his prostate cancer versus LT-ADT concurrent with 8 weeks of daily external radiation to the prostate, pelvic lymph nodes and bony metastases in the pelvis with a boost to the prostate, PET positive lymph nodes and bony metastases, concurrent with LT-ADT, for definitive treatment. We reviewed the details and logistics of delivery as well as the risks, benefits, short and long-term effects associated with daily external beam radiotherapy and compared and contrasted these with prostatectomy.  He understands that while definitive treatment gives him the best chance for cure, albeit 25-30% likelihood, it does carry a very high likelihood for more durable disease control long-term.  He was  encouraged to ask questions that were answered to his stated satisfaction.  At the conclusion of our conversation, the patient is ready to proceed with the recommended 8-week course of daily external radiation to the prostate, pelvic lymph nodes and bony metastases in the pelvis with a boost to the prostate, PET positive lymph nodes and bony metastases, concurrent with LT-ADT.  He has already started ADT on 08/10/2023 and would like to start treatments as soon as possible. Therefore, he prefers to forego the procedure for fiducials and SpaceOAR gel, so we will share our discussion with Dr. Parke Norris and proceed with treatment planning accordingly. He has freely signed written consent to proceed today in the office and a copy of this document will be placed in his medical record.  He is tentatively scheduled for CT simulation/treatment planning on 11/05/2023 at 8 AM, in anticipation of beginning his treatments in the near future.  We enjoyed meeting with him and his wife, Gregary Lean, again today and look forward to continuing to participate in his care.  I personally spent 45 minutes in this encounter including chart review, reviewing radiological studies, meeting face-to-face with the patient, entering orders and completing documentation.    Arta Bihari, PA-C    Kenith Payer, MD  [facility]  Radiation Oncology Direct Dial: [phone]  Fax: [phone] [email or web address]  Skype  LinkedIn

## 2023-11-05 ENCOUNTER — Ambulatory Visit
Admission: RE | Admit: 2023-11-05 | Discharge: 2023-11-05 | Disposition: A | Discharge status: Home / Self Care | Source: Ambulatory Visit | Attending: Radiation Oncology | Admitting: Radiation Oncology

## 2023-11-05 DIAGNOSIS — C775 Secondary and unspecified malignant neoplasm of intrapelvic lymph nodes: Secondary | ICD-10-CM | POA: Insufficient documentation

## 2023-11-05 DIAGNOSIS — C61 Malignant neoplasm of prostate: Secondary | ICD-10-CM | POA: Insufficient documentation

## 2023-11-05 DIAGNOSIS — C7951 Secondary malignant neoplasm of bone: Secondary | ICD-10-CM | POA: Diagnosis present

## 2023-11-08 NOTE — Progress Notes (Signed)
  Radiation Oncology         [phone] ________________________________  Name: Daniel Norris MRN: [id number]  Date: 11/05/2023  DOB: [birth year]  SIMULATION AND TREATMENT PLANNING NOTE    ICD-10-CM   1. Malignant neoplasm of prostate (HCC)  C61       DIAGNOSIS:   68 y.o. 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  NARRATIVE:  The patient was brought to the CT Simulation planning suite.  Identity was confirmed.  All relevant records and images related to the planned course of therapy were reviewed.  The patient freely provided informed written consent to proceed with treatment after reviewing the details related to the planned course of therapy. The consent form was witnessed and verified by the simulation staff.  Then, the patient was set-up in a stable reproducible supine position for radiation therapy.  A vacuum lock pillow device was custom fabricated to position his legs in a reproducible immobilized position.  Then, I performed a urethrogram under sterile conditions to identify the prostatic bed.  CT images were obtained.  Surface markings were placed.  The CT images were loaded into the planning software.  Then the prostate bed target, pelvic lymph node target and avoidance structures including the rectum, bladder, bowel and hips were contoured.  Treatment planning then occurred.  The radiation prescription was entered and confirmed.  A total of one complex treatment devices were fabricated. I have requested : Intensity Modulated Radiotherapy (IMRT) is medically necessary for this case for the following reason:  Rectal sparing.Aaron Aas  PLAN:  The patient will receive 45 Gy in 25 fractions of 1.8 Gy, followed by a boost to the prostate, PET positive nodes and two pelvic bone mets to a total dose of 75 Gy with 15 additional fractions of 2 Gy.   ________________________________  Trilby Fujisawa Lorri Rota, M.D.

## 2023-11-09 ENCOUNTER — Telehealth: Payer: Self-pay

## 2023-11-09 NOTE — Telephone Encounter (Signed)
 Spouse Ms. Augustin Leber called about schedule questions for pt..   Identity verified x2.  Questions about patient's schedule answered.  This concludes the interaction.  Avery Bodo, LPN

## 2023-11-17 DIAGNOSIS — C61 Malignant neoplasm of prostate: Secondary | ICD-10-CM | POA: Diagnosis not present

## 2023-11-18 ENCOUNTER — Ambulatory Visit

## 2023-11-19 ENCOUNTER — Ambulatory Visit

## 2023-11-22 ENCOUNTER — Other Ambulatory Visit: Payer: Self-pay

## 2023-11-22 ENCOUNTER — Ambulatory Visit
Admission: RE | Admit: 2023-11-22 | Discharge: 2023-11-22 | Disposition: A | Discharge status: Home / Self Care | Source: Ambulatory Visit | Attending: Radiation Oncology | Admitting: Radiation Oncology

## 2023-11-22 DIAGNOSIS — R339 Retention of urine, unspecified: Secondary | ICD-10-CM | POA: Diagnosis not present

## 2023-11-22 DIAGNOSIS — C775 Secondary and unspecified malignant neoplasm of intrapelvic lymph nodes: Secondary | ICD-10-CM | POA: Diagnosis not present

## 2023-11-22 DIAGNOSIS — C61 Malignant neoplasm of prostate: Secondary | ICD-10-CM | POA: Insufficient documentation

## 2023-11-22 DIAGNOSIS — C7951 Secondary malignant neoplasm of bone: Secondary | ICD-10-CM | POA: Insufficient documentation

## 2023-11-22 DIAGNOSIS — Z510 Encounter for antineoplastic radiation therapy: Secondary | ICD-10-CM | POA: Insufficient documentation

## 2023-11-22 LAB — RAD ONC ARIA SESSION SUMMARY
Course Elapsed Days: 0
Plan Fractions Treated to Date: 1
Plan Prescribed Dose Per Fraction: 1.8 Gy
Plan Total Fractions Prescribed: 25
Plan Total Prescribed Dose: 45 Gy
Reference Point Dosage Given to Date: 1.8 Gy
Reference Point Session Dosage Given: 1.8 Gy
Session Number: 1

## 2023-11-23 ENCOUNTER — Ambulatory Visit

## 2023-11-24 ENCOUNTER — Other Ambulatory Visit: Payer: Self-pay

## 2023-11-24 ENCOUNTER — Ambulatory Visit
Admission: RE | Admit: 2023-11-24 | Discharge: 2023-11-24 | Disposition: A | Discharge status: Home / Self Care | Source: Ambulatory Visit | Attending: Radiation Oncology

## 2023-11-24 DIAGNOSIS — Z510 Encounter for antineoplastic radiation therapy: Secondary | ICD-10-CM | POA: Diagnosis not present

## 2023-11-24 LAB — RAD ONC ARIA SESSION SUMMARY
Course Elapsed Days: 2
Plan Fractions Treated to Date: 2
Plan Prescribed Dose Per Fraction: 1.8 Gy
Plan Total Fractions Prescribed: 25
Plan Total Prescribed Dose: 45 Gy
Reference Point Dosage Given to Date: 3.6 Gy
Reference Point Session Dosage Given: 1.8 Gy
Session Number: 2

## 2023-11-25 ENCOUNTER — Other Ambulatory Visit: Payer: Self-pay

## 2023-11-25 ENCOUNTER — Ambulatory Visit
Admission: RE | Admit: 2023-11-25 | Discharge: 2023-11-25 | Disposition: A | Discharge status: Home / Self Care | Source: Ambulatory Visit | Attending: Radiation Oncology

## 2023-11-25 DIAGNOSIS — Z510 Encounter for antineoplastic radiation therapy: Secondary | ICD-10-CM | POA: Diagnosis not present

## 2023-11-25 LAB — RAD ONC ARIA SESSION SUMMARY
Course Elapsed Days: 3
Plan Fractions Treated to Date: 3
Plan Prescribed Dose Per Fraction: 1.8 Gy
Plan Total Fractions Prescribed: 25
Plan Total Prescribed Dose: 45 Gy
Reference Point Dosage Given to Date: 5.4 Gy
Reference Point Session Dosage Given: 1.8 Gy
Session Number: 3

## 2023-11-26 ENCOUNTER — Ambulatory Visit
Admission: RE | Admit: 2023-11-26 | Discharge: 2023-11-26 | Disposition: A | Discharge status: Home / Self Care | Source: Ambulatory Visit | Attending: Radiation Oncology | Admitting: Radiation Oncology

## 2023-11-26 ENCOUNTER — Other Ambulatory Visit: Payer: Self-pay

## 2023-11-26 DIAGNOSIS — Z510 Encounter for antineoplastic radiation therapy: Secondary | ICD-10-CM | POA: Diagnosis not present

## 2023-11-26 LAB — RAD ONC ARIA SESSION SUMMARY
Course Elapsed Days: 4
Plan Fractions Treated to Date: 4
Plan Prescribed Dose Per Fraction: 1.8 Gy
Plan Total Fractions Prescribed: 25
Plan Total Prescribed Dose: 45 Gy
Reference Point Dosage Given to Date: 7.2 Gy
Reference Point Session Dosage Given: 1.8 Gy
Session Number: 4

## 2023-11-29 ENCOUNTER — Ambulatory Visit

## 2023-11-30 ENCOUNTER — Ambulatory Visit
Admission: RE | Admit: 2023-11-30 | Discharge: 2023-11-30 | Disposition: A | Discharge status: Home / Self Care | Source: Ambulatory Visit | Attending: Radiation Oncology

## 2023-11-30 ENCOUNTER — Other Ambulatory Visit: Payer: Self-pay

## 2023-11-30 DIAGNOSIS — Z510 Encounter for antineoplastic radiation therapy: Secondary | ICD-10-CM | POA: Diagnosis not present

## 2023-11-30 LAB — RAD ONC ARIA SESSION SUMMARY
Course Elapsed Days: 8
Plan Fractions Treated to Date: 5
Plan Prescribed Dose Per Fraction: 1.8 Gy
Plan Total Fractions Prescribed: 25
Plan Total Prescribed Dose: 45 Gy
Reference Point Dosage Given to Date: 9 Gy
Reference Point Session Dosage Given: 1.8 Gy
Session Number: 5

## 2023-12-01 ENCOUNTER — Ambulatory Visit
Admission: RE | Admit: 2023-12-01 | Discharge: 2023-12-01 | Disposition: A | Discharge status: Home / Self Care | Source: Ambulatory Visit | Attending: Radiation Oncology | Admitting: Radiation Oncology

## 2023-12-01 ENCOUNTER — Other Ambulatory Visit: Payer: Self-pay

## 2023-12-01 DIAGNOSIS — Z510 Encounter for antineoplastic radiation therapy: Secondary | ICD-10-CM | POA: Diagnosis not present

## 2023-12-01 LAB — RAD ONC ARIA SESSION SUMMARY
Course Elapsed Days: 9
Plan Fractions Treated to Date: 6
Plan Prescribed Dose Per Fraction: 1.8 Gy
Plan Total Fractions Prescribed: 25
Plan Total Prescribed Dose: 45 Gy
Reference Point Dosage Given to Date: 10.8 Gy
Reference Point Session Dosage Given: 1.8 Gy
Session Number: 6

## 2023-12-02 ENCOUNTER — Ambulatory Visit
Admission: RE | Admit: 2023-12-02 | Discharge: 2023-12-02 | Disposition: A | Discharge status: Home / Self Care | Source: Ambulatory Visit | Attending: Radiation Oncology | Admitting: Radiation Oncology

## 2023-12-02 ENCOUNTER — Other Ambulatory Visit: Payer: Self-pay

## 2023-12-02 DIAGNOSIS — Z510 Encounter for antineoplastic radiation therapy: Secondary | ICD-10-CM | POA: Diagnosis not present

## 2023-12-02 LAB — RAD ONC ARIA SESSION SUMMARY
Course Elapsed Days: 10
Plan Fractions Treated to Date: 7
Plan Prescribed Dose Per Fraction: 1.8 Gy
Plan Total Fractions Prescribed: 25
Plan Total Prescribed Dose: 45 Gy
Reference Point Dosage Given to Date: 12.6 Gy
Reference Point Session Dosage Given: 1.8 Gy
Session Number: 7

## 2023-12-03 ENCOUNTER — Telehealth: Payer: Self-pay

## 2023-12-03 ENCOUNTER — Ambulatory Visit
Admission: RE | Admit: 2023-12-03 | Discharge: 2023-12-03 | Disposition: A | Discharge status: Home / Self Care | Source: Ambulatory Visit | Attending: Radiation Oncology

## 2023-12-03 ENCOUNTER — Other Ambulatory Visit: Payer: Self-pay

## 2023-12-03 ENCOUNTER — Ambulatory Visit

## 2023-12-03 DIAGNOSIS — R350 Frequency of micturition: Secondary | ICD-10-CM

## 2023-12-03 DIAGNOSIS — Z510 Encounter for antineoplastic radiation therapy: Secondary | ICD-10-CM | POA: Diagnosis not present

## 2023-12-03 LAB — URINALYSIS, COMPLETE (UACMP) WITH MICROSCOPIC
Bacteria, UA: NONE SEEN
Bilirubin Urine: NEGATIVE
Glucose, UA: NEGATIVE mg/dL
Ketones, ur: NEGATIVE mg/dL
Leukocytes,Ua: NEGATIVE
Nitrite: NEGATIVE
Protein, ur: NEGATIVE mg/dL
Specific Gravity, Urine: 1.008 (ref 1.005–1.030)
pH: 6 (ref 5.0–8.0)

## 2023-12-03 LAB — RAD ONC ARIA SESSION SUMMARY
Course Elapsed Days: 11
Plan Fractions Treated to Date: 8
Plan Prescribed Dose Per Fraction: 1.8 Gy
Plan Total Fractions Prescribed: 25
Plan Total Prescribed Dose: 45 Gy
Reference Point Dosage Given to Date: 14.4 Gy
Reference Point Session Dosage Given: 1.8 Gy
Session Number: 8

## 2023-12-03 NOTE — Telephone Encounter (Signed)
 RN called Mr. Homewood to inform him of urinalysis results negative that we will wait for urine culture results.  And to ask some follow-up questions per [employer], PA-C.

## 2023-12-03 NOTE — Telephone Encounter (Signed)
 RN called Daniel Norris and went over suggestions to help with his urinary frequency without having to add to his medication list per the provider.  Daniel Norris was appreciative of the information and will try it over the weekend.  He will let us  know if it helped or not.

## 2023-12-04 LAB — URINE CULTURE: Culture: NO GROWTH

## 2023-12-06 ENCOUNTER — Telehealth: Payer: Self-pay

## 2023-12-06 ENCOUNTER — Ambulatory Visit
Admission: RE | Admit: 2023-12-06 | Discharge: 2023-12-06 | Disposition: A | Discharge status: Home / Self Care | Source: Ambulatory Visit | Attending: Radiation Oncology | Admitting: Radiation Oncology

## 2023-12-06 ENCOUNTER — Other Ambulatory Visit: Payer: Self-pay

## 2023-12-06 DIAGNOSIS — Z510 Encounter for antineoplastic radiation therapy: Secondary | ICD-10-CM | POA: Diagnosis not present

## 2023-12-06 LAB — RAD ONC ARIA SESSION SUMMARY
Course Elapsed Days: 14
Plan Fractions Treated to Date: 9
Plan Prescribed Dose Per Fraction: 1.8 Gy
Plan Total Fractions Prescribed: 25
Plan Total Prescribed Dose: 45 Gy
Reference Point Dosage Given to Date: 16.2 Gy
Reference Point Session Dosage Given: 1.8 Gy
Session Number: 9

## 2023-12-06 NOTE — Telephone Encounter (Signed)
 RN called Daniel Norris to give urine culture results and to see if recommendations given last week were effective or not.  No answer left message to return call.

## 2023-12-07 ENCOUNTER — Other Ambulatory Visit: Payer: Self-pay

## 2023-12-07 ENCOUNTER — Ambulatory Visit
Admission: RE | Admit: 2023-12-07 | Discharge: 2023-12-07 | Disposition: A | Discharge status: Home / Self Care | Source: Ambulatory Visit | Attending: Radiation Oncology | Admitting: Radiation Oncology

## 2023-12-07 DIAGNOSIS — R350 Frequency of micturition: Secondary | ICD-10-CM

## 2023-12-07 DIAGNOSIS — Z510 Encounter for antineoplastic radiation therapy: Secondary | ICD-10-CM | POA: Diagnosis not present

## 2023-12-07 DIAGNOSIS — C61 Malignant neoplasm of prostate: Secondary | ICD-10-CM

## 2023-12-07 LAB — RAD ONC ARIA SESSION SUMMARY
Course Elapsed Days: 15
Plan Fractions Treated to Date: 10
Plan Prescribed Dose Per Fraction: 1.8 Gy
Plan Total Fractions Prescribed: 25
Plan Total Prescribed Dose: 45 Gy
Reference Point Dosage Given to Date: 18 Gy
Reference Point Session Dosage Given: 1.8 Gy
Session Number: 10

## 2023-12-08 ENCOUNTER — Other Ambulatory Visit: Payer: Self-pay

## 2023-12-08 ENCOUNTER — Ambulatory Visit
Admission: RE | Admit: 2023-12-08 | Discharge: 2023-12-08 | Disposition: A | Discharge status: Home / Self Care | Source: Ambulatory Visit | Attending: Radiation Oncology | Admitting: Radiation Oncology

## 2023-12-08 DIAGNOSIS — Z510 Encounter for antineoplastic radiation therapy: Secondary | ICD-10-CM | POA: Diagnosis not present

## 2023-12-08 LAB — RAD ONC ARIA SESSION SUMMARY
Course Elapsed Days: 16
Plan Fractions Treated to Date: 11
Plan Prescribed Dose Per Fraction: 1.8 Gy
Plan Total Fractions Prescribed: 25
Plan Total Prescribed Dose: 45 Gy
Reference Point Dosage Given to Date: 19.8 Gy
Reference Point Session Dosage Given: 1.8 Gy
Session Number: 11

## 2023-12-09 ENCOUNTER — Ambulatory Visit
Admission: RE | Admit: 2023-12-09 | Discharge: 2023-12-09 | Disposition: A | Discharge status: Home / Self Care | Source: Ambulatory Visit | Attending: Radiation Oncology | Admitting: Radiation Oncology

## 2023-12-09 ENCOUNTER — Other Ambulatory Visit: Payer: Self-pay

## 2023-12-09 DIAGNOSIS — Z510 Encounter for antineoplastic radiation therapy: Secondary | ICD-10-CM | POA: Diagnosis not present

## 2023-12-09 LAB — RAD ONC ARIA SESSION SUMMARY
Course Elapsed Days: 17
Plan Fractions Treated to Date: 12
Plan Prescribed Dose Per Fraction: 1.8 Gy
Plan Total Fractions Prescribed: 25
Plan Total Prescribed Dose: 45 Gy
Reference Point Dosage Given to Date: 21.6 Gy
Reference Point Session Dosage Given: 1.8 Gy
Session Number: 12

## 2023-12-10 ENCOUNTER — Ambulatory Visit
Admission: RE | Admit: 2023-12-10 | Discharge: 2023-12-10 | Disposition: A | Discharge status: Home / Self Care | Source: Ambulatory Visit | Attending: Radiation Oncology | Admitting: Radiation Oncology

## 2023-12-10 ENCOUNTER — Other Ambulatory Visit: Payer: Self-pay

## 2023-12-10 DIAGNOSIS — Z510 Encounter for antineoplastic radiation therapy: Secondary | ICD-10-CM | POA: Diagnosis not present

## 2023-12-10 LAB — RAD ONC ARIA SESSION SUMMARY
Course Elapsed Days: 18
Plan Fractions Treated to Date: 13
Plan Prescribed Dose Per Fraction: 1.8 Gy
Plan Total Fractions Prescribed: 25
Plan Total Prescribed Dose: 45 Gy
Reference Point Dosage Given to Date: 23.4 Gy
Reference Point Session Dosage Given: 1.8 Gy
Session Number: 13

## 2023-12-13 ENCOUNTER — Other Ambulatory Visit: Payer: Self-pay

## 2023-12-13 ENCOUNTER — Ambulatory Visit
Admission: RE | Admit: 2023-12-13 | Discharge: 2023-12-13 | Disposition: A | Discharge status: Home / Self Care | Source: Ambulatory Visit | Attending: Radiation Oncology | Admitting: Radiation Oncology

## 2023-12-13 DIAGNOSIS — Z510 Encounter for antineoplastic radiation therapy: Secondary | ICD-10-CM | POA: Diagnosis not present

## 2023-12-13 LAB — RAD ONC ARIA SESSION SUMMARY
Course Elapsed Days: 21
Plan Fractions Treated to Date: 14
Plan Prescribed Dose Per Fraction: 1.8 Gy
Plan Total Fractions Prescribed: 25
Plan Total Prescribed Dose: 45 Gy
Reference Point Dosage Given to Date: 25.2 Gy
Reference Point Session Dosage Given: 1.8 Gy
Session Number: 14

## 2023-12-14 ENCOUNTER — Other Ambulatory Visit: Payer: Self-pay

## 2023-12-14 ENCOUNTER — Ambulatory Visit
Admission: RE | Admit: 2023-12-14 | Discharge: 2023-12-14 | Disposition: A | Discharge status: Home / Self Care | Source: Ambulatory Visit | Attending: Radiation Oncology | Admitting: Radiation Oncology

## 2023-12-14 DIAGNOSIS — Z510 Encounter for antineoplastic radiation therapy: Secondary | ICD-10-CM | POA: Diagnosis not present

## 2023-12-14 LAB — RAD ONC ARIA SESSION SUMMARY
Course Elapsed Days: 22
Plan Fractions Treated to Date: 15
Plan Prescribed Dose Per Fraction: 1.8 Gy
Plan Total Fractions Prescribed: 25
Plan Total Prescribed Dose: 45 Gy
Reference Point Dosage Given to Date: 27 Gy
Reference Point Session Dosage Given: 1.8 Gy
Session Number: 15

## 2023-12-15 ENCOUNTER — Other Ambulatory Visit: Payer: Self-pay

## 2023-12-15 ENCOUNTER — Ambulatory Visit
Admission: RE | Admit: 2023-12-15 | Discharge: 2023-12-15 | Disposition: A | Discharge status: Home / Self Care | Source: Ambulatory Visit | Attending: Radiation Oncology

## 2023-12-15 DIAGNOSIS — Z510 Encounter for antineoplastic radiation therapy: Secondary | ICD-10-CM | POA: Diagnosis not present

## 2023-12-15 LAB — RAD ONC ARIA SESSION SUMMARY
Course Elapsed Days: 23
Plan Fractions Treated to Date: 16
Plan Prescribed Dose Per Fraction: 1.8 Gy
Plan Total Fractions Prescribed: 25
Plan Total Prescribed Dose: 45 Gy
Reference Point Dosage Given to Date: 28.8 Gy
Reference Point Session Dosage Given: 1.8 Gy
Session Number: 16

## 2023-12-16 ENCOUNTER — Ambulatory Visit
Admission: RE | Admit: 2023-12-16 | Discharge: 2023-12-16 | Disposition: A | Discharge status: Home / Self Care | Source: Ambulatory Visit | Attending: Radiation Oncology | Admitting: Radiation Oncology

## 2023-12-16 ENCOUNTER — Other Ambulatory Visit: Payer: Self-pay

## 2023-12-16 DIAGNOSIS — Z510 Encounter for antineoplastic radiation therapy: Secondary | ICD-10-CM | POA: Diagnosis not present

## 2023-12-16 LAB — RAD ONC ARIA SESSION SUMMARY
Course Elapsed Days: 24
Plan Fractions Treated to Date: 17
Plan Prescribed Dose Per Fraction: 1.8 Gy
Plan Total Fractions Prescribed: 25
Plan Total Prescribed Dose: 45 Gy
Reference Point Dosage Given to Date: 30.6 Gy
Reference Point Session Dosage Given: 1.8 Gy
Session Number: 17

## 2023-12-17 ENCOUNTER — Ambulatory Visit
Admission: RE | Admit: 2023-12-17 | Discharge: 2023-12-17 | Disposition: A | Discharge status: Home / Self Care | Source: Ambulatory Visit | Attending: Radiation Oncology

## 2023-12-17 ENCOUNTER — Ambulatory Visit
Admission: RE | Admit: 2023-12-17 | Discharge: 2023-12-17 | Disposition: A | Discharge status: Home / Self Care | Source: Ambulatory Visit | Attending: Radiation Oncology | Admitting: Radiation Oncology

## 2023-12-17 ENCOUNTER — Other Ambulatory Visit: Payer: Self-pay

## 2023-12-17 DIAGNOSIS — Z510 Encounter for antineoplastic radiation therapy: Secondary | ICD-10-CM | POA: Diagnosis not present

## 2023-12-17 LAB — RAD ONC ARIA SESSION SUMMARY
Course Elapsed Days: 25
Plan Fractions Treated to Date: 18
Plan Prescribed Dose Per Fraction: 1.8 Gy
Plan Total Fractions Prescribed: 25
Plan Total Prescribed Dose: 45 Gy
Reference Point Dosage Given to Date: 32.4 Gy
Reference Point Session Dosage Given: 1.8 Gy
Session Number: 18

## 2023-12-20 ENCOUNTER — Ambulatory Visit
Admission: RE | Admit: 2023-12-20 | Discharge: 2023-12-20 | Disposition: A | Discharge status: Home / Self Care | Source: Ambulatory Visit | Attending: Radiation Oncology | Admitting: Radiation Oncology

## 2023-12-20 ENCOUNTER — Other Ambulatory Visit: Payer: Self-pay

## 2023-12-20 DIAGNOSIS — Z510 Encounter for antineoplastic radiation therapy: Secondary | ICD-10-CM | POA: Diagnosis not present

## 2023-12-20 LAB — RAD ONC ARIA SESSION SUMMARY
Course Elapsed Days: 28
Plan Fractions Treated to Date: 19
Plan Prescribed Dose Per Fraction: 1.8 Gy
Plan Total Fractions Prescribed: 25
Plan Total Prescribed Dose: 45 Gy
Reference Point Dosage Given to Date: 34.2 Gy
Reference Point Session Dosage Given: 1.8 Gy
Session Number: 19

## 2023-12-21 ENCOUNTER — Ambulatory Visit
Admission: RE | Admit: 2023-12-21 | Discharge: 2023-12-21 | Disposition: A | Discharge status: Home / Self Care | Source: Ambulatory Visit | Attending: Radiation Oncology | Admitting: Radiation Oncology

## 2023-12-21 ENCOUNTER — Other Ambulatory Visit: Payer: Self-pay

## 2023-12-21 DIAGNOSIS — C775 Secondary and unspecified malignant neoplasm of intrapelvic lymph nodes: Secondary | ICD-10-CM | POA: Diagnosis not present

## 2023-12-21 DIAGNOSIS — Z510 Encounter for antineoplastic radiation therapy: Secondary | ICD-10-CM | POA: Insufficient documentation

## 2023-12-21 DIAGNOSIS — R339 Retention of urine, unspecified: Secondary | ICD-10-CM | POA: Insufficient documentation

## 2023-12-21 DIAGNOSIS — C7951 Secondary malignant neoplasm of bone: Secondary | ICD-10-CM | POA: Insufficient documentation

## 2023-12-21 DIAGNOSIS — C61 Malignant neoplasm of prostate: Secondary | ICD-10-CM | POA: Insufficient documentation

## 2023-12-21 LAB — RAD ONC ARIA SESSION SUMMARY
Course Elapsed Days: 29
Plan Fractions Treated to Date: 20
Plan Prescribed Dose Per Fraction: 1.8 Gy
Plan Total Fractions Prescribed: 25
Plan Total Prescribed Dose: 45 Gy
Reference Point Dosage Given to Date: 36 Gy
Reference Point Session Dosage Given: 1.8 Gy
Session Number: 20

## 2023-12-22 ENCOUNTER — Ambulatory Visit
Admission: RE | Admit: 2023-12-22 | Discharge: 2023-12-22 | Disposition: A | Discharge status: Home / Self Care | Source: Ambulatory Visit | Attending: Radiation Oncology | Admitting: Radiation Oncology

## 2023-12-22 ENCOUNTER — Other Ambulatory Visit: Payer: Self-pay

## 2023-12-22 DIAGNOSIS — Z510 Encounter for antineoplastic radiation therapy: Secondary | ICD-10-CM | POA: Diagnosis not present

## 2023-12-22 LAB — RAD ONC ARIA SESSION SUMMARY
Course Elapsed Days: 30
Plan Fractions Treated to Date: 21
Plan Prescribed Dose Per Fraction: 1.8 Gy
Plan Total Fractions Prescribed: 25
Plan Total Prescribed Dose: 45 Gy
Reference Point Dosage Given to Date: 37.8 Gy
Reference Point Session Dosage Given: 1.8 Gy
Session Number: 21

## 2023-12-23 ENCOUNTER — Ambulatory Visit
Admission: RE | Admit: 2023-12-23 | Discharge: 2023-12-23 | Disposition: A | Discharge status: Home / Self Care | Source: Ambulatory Visit | Attending: Radiation Oncology | Admitting: Radiation Oncology

## 2023-12-23 ENCOUNTER — Other Ambulatory Visit: Payer: Self-pay

## 2023-12-23 DIAGNOSIS — Z510 Encounter for antineoplastic radiation therapy: Secondary | ICD-10-CM | POA: Diagnosis not present

## 2023-12-23 LAB — RAD ONC ARIA SESSION SUMMARY
Course Elapsed Days: 31
Plan Fractions Treated to Date: 22
Plan Prescribed Dose Per Fraction: 1.8 Gy
Plan Total Fractions Prescribed: 25
Plan Total Prescribed Dose: 45 Gy
Reference Point Dosage Given to Date: 39.6 Gy
Reference Point Session Dosage Given: 1.8 Gy
Session Number: 22

## 2023-12-27 ENCOUNTER — Other Ambulatory Visit: Payer: Self-pay

## 2023-12-27 ENCOUNTER — Ambulatory Visit
Admission: RE | Admit: 2023-12-27 | Discharge: 2023-12-27 | Disposition: A | Discharge status: Home / Self Care | Source: Ambulatory Visit | Attending: Radiation Oncology

## 2023-12-27 DIAGNOSIS — Z510 Encounter for antineoplastic radiation therapy: Secondary | ICD-10-CM | POA: Diagnosis not present

## 2023-12-27 LAB — RAD ONC ARIA SESSION SUMMARY
Course Elapsed Days: 35
Plan Fractions Treated to Date: 23
Plan Prescribed Dose Per Fraction: 1.8 Gy
Plan Total Fractions Prescribed: 25
Plan Total Prescribed Dose: 45 Gy
Reference Point Dosage Given to Date: 41.4 Gy
Reference Point Session Dosage Given: 1.8 Gy
Session Number: 23

## 2023-12-28 ENCOUNTER — Ambulatory Visit
Admission: RE | Admit: 2023-12-28 | Discharge: 2023-12-28 | Disposition: A | Discharge status: Home / Self Care | Source: Ambulatory Visit | Attending: Radiation Oncology

## 2023-12-28 ENCOUNTER — Other Ambulatory Visit: Payer: Self-pay

## 2023-12-28 DIAGNOSIS — Z510 Encounter for antineoplastic radiation therapy: Secondary | ICD-10-CM | POA: Diagnosis not present

## 2023-12-28 LAB — RAD ONC ARIA SESSION SUMMARY
Course Elapsed Days: 36
Plan Fractions Treated to Date: 24
Plan Prescribed Dose Per Fraction: 1.8 Gy
Plan Total Fractions Prescribed: 25
Plan Total Prescribed Dose: 45 Gy
Reference Point Dosage Given to Date: 43.2 Gy
Reference Point Session Dosage Given: 1.8 Gy
Session Number: 24

## 2023-12-29 ENCOUNTER — Other Ambulatory Visit: Payer: Self-pay

## 2023-12-29 ENCOUNTER — Ambulatory Visit

## 2023-12-29 ENCOUNTER — Ambulatory Visit
Admission: RE | Admit: 2023-12-29 | Discharge: 2023-12-29 | Disposition: A | Discharge status: Home / Self Care | Source: Ambulatory Visit | Attending: Radiation Oncology | Admitting: Radiation Oncology

## 2023-12-29 DIAGNOSIS — Z510 Encounter for antineoplastic radiation therapy: Secondary | ICD-10-CM | POA: Diagnosis not present

## 2023-12-29 LAB — RAD ONC ARIA SESSION SUMMARY
Course Elapsed Days: 37
Plan Fractions Treated to Date: 25
Plan Prescribed Dose Per Fraction: 1.8 Gy
Plan Total Fractions Prescribed: 25
Plan Total Prescribed Dose: 45 Gy
Reference Point Dosage Given to Date: 45 Gy
Reference Point Session Dosage Given: 1.8 Gy
Session Number: 25

## 2023-12-30 ENCOUNTER — Ambulatory Visit

## 2023-12-30 ENCOUNTER — Other Ambulatory Visit: Payer: Self-pay

## 2023-12-30 ENCOUNTER — Ambulatory Visit
Admission: RE | Admit: 2023-12-30 | Discharge: 2023-12-30 | Disposition: A | Discharge status: Home / Self Care | Source: Ambulatory Visit | Attending: Radiation Oncology | Admitting: Radiation Oncology

## 2023-12-30 DIAGNOSIS — Z510 Encounter for antineoplastic radiation therapy: Secondary | ICD-10-CM | POA: Diagnosis not present

## 2023-12-30 LAB — RAD ONC ARIA SESSION SUMMARY
Course Elapsed Days: 38
Plan Fractions Treated to Date: 1
Plan Prescribed Dose Per Fraction: 2 Gy
Plan Total Fractions Prescribed: 15
Plan Total Prescribed Dose: 30 Gy
Reference Point Dosage Given to Date: 2 Gy
Reference Point Session Dosage Given: 2 Gy
Session Number: 26

## 2023-12-31 ENCOUNTER — Ambulatory Visit
Admission: RE | Admit: 2023-12-31 | Discharge: 2023-12-31 | Disposition: A | Discharge status: Home / Self Care | Source: Ambulatory Visit | Attending: Radiation Oncology | Admitting: Radiation Oncology

## 2023-12-31 ENCOUNTER — Other Ambulatory Visit: Payer: Self-pay

## 2023-12-31 DIAGNOSIS — Z510 Encounter for antineoplastic radiation therapy: Secondary | ICD-10-CM | POA: Diagnosis not present

## 2023-12-31 LAB — RAD ONC ARIA SESSION SUMMARY
Course Elapsed Days: 39
Plan Fractions Treated to Date: 2
Plan Prescribed Dose Per Fraction: 2 Gy
Plan Total Fractions Prescribed: 15
Plan Total Prescribed Dose: 30 Gy
Reference Point Dosage Given to Date: 4 Gy
Reference Point Session Dosage Given: 2 Gy
Session Number: 27

## 2024-01-03 ENCOUNTER — Ambulatory Visit
Admission: RE | Admit: 2024-01-03 | Discharge: 2024-01-03 | Disposition: A | Discharge status: Home / Self Care | Source: Ambulatory Visit | Attending: Radiation Oncology

## 2024-01-03 ENCOUNTER — Other Ambulatory Visit: Payer: Self-pay

## 2024-01-03 DIAGNOSIS — Z510 Encounter for antineoplastic radiation therapy: Secondary | ICD-10-CM | POA: Diagnosis not present

## 2024-01-03 LAB — RAD ONC ARIA SESSION SUMMARY
Course Elapsed Days: 42
Plan Fractions Treated to Date: 3
Plan Prescribed Dose Per Fraction: 2 Gy
Plan Total Fractions Prescribed: 15
Plan Total Prescribed Dose: 30 Gy
Reference Point Dosage Given to Date: 6 Gy
Reference Point Session Dosage Given: 2 Gy
Session Number: 28

## 2024-01-04 ENCOUNTER — Ambulatory Visit
Admission: RE | Admit: 2024-01-04 | Discharge: 2024-01-04 | Disposition: A | Discharge status: Home / Self Care | Source: Ambulatory Visit | Attending: Radiation Oncology | Admitting: Radiation Oncology

## 2024-01-04 ENCOUNTER — Other Ambulatory Visit: Payer: Self-pay

## 2024-01-04 DIAGNOSIS — Z510 Encounter for antineoplastic radiation therapy: Secondary | ICD-10-CM | POA: Diagnosis not present

## 2024-01-04 LAB — RAD ONC ARIA SESSION SUMMARY
Course Elapsed Days: 43
Plan Fractions Treated to Date: 4
Plan Prescribed Dose Per Fraction: 2 Gy
Plan Total Fractions Prescribed: 15
Plan Total Prescribed Dose: 30 Gy
Reference Point Dosage Given to Date: 8 Gy
Reference Point Session Dosage Given: 2 Gy
Session Number: 29

## 2024-01-05 ENCOUNTER — Other Ambulatory Visit: Payer: Self-pay

## 2024-01-05 ENCOUNTER — Ambulatory Visit
Admission: RE | Admit: 2024-01-05 | Discharge: 2024-01-05 | Disposition: A | Discharge status: Home / Self Care | Source: Ambulatory Visit | Attending: Radiation Oncology | Admitting: Radiation Oncology

## 2024-01-05 DIAGNOSIS — Z510 Encounter for antineoplastic radiation therapy: Secondary | ICD-10-CM | POA: Diagnosis not present

## 2024-01-05 LAB — RAD ONC ARIA SESSION SUMMARY
Course Elapsed Days: 44
Plan Fractions Treated to Date: 5
Plan Prescribed Dose Per Fraction: 2 Gy
Plan Total Fractions Prescribed: 15
Plan Total Prescribed Dose: 30 Gy
Reference Point Dosage Given to Date: 10 Gy
Reference Point Session Dosage Given: 2 Gy
Session Number: 30

## 2024-01-06 ENCOUNTER — Ambulatory Visit
Admission: RE | Admit: 2024-01-06 | Discharge: 2024-01-06 | Disposition: A | Discharge status: Home / Self Care | Source: Ambulatory Visit | Attending: Radiation Oncology

## 2024-01-06 ENCOUNTER — Other Ambulatory Visit: Payer: Self-pay

## 2024-01-06 DIAGNOSIS — Z510 Encounter for antineoplastic radiation therapy: Secondary | ICD-10-CM | POA: Diagnosis not present

## 2024-01-06 LAB — RAD ONC ARIA SESSION SUMMARY
Course Elapsed Days: 45
Plan Fractions Treated to Date: 6
Plan Prescribed Dose Per Fraction: 2 Gy
Plan Total Fractions Prescribed: 15
Plan Total Prescribed Dose: 30 Gy
Reference Point Dosage Given to Date: 12 Gy
Reference Point Session Dosage Given: 2 Gy
Session Number: 31

## 2024-01-07 ENCOUNTER — Ambulatory Visit
Admission: RE | Admit: 2024-01-07 | Discharge: 2024-01-07 | Disposition: A | Discharge status: Home / Self Care | Source: Ambulatory Visit | Attending: Radiation Oncology | Admitting: Radiation Oncology

## 2024-01-07 ENCOUNTER — Other Ambulatory Visit: Payer: Self-pay

## 2024-01-07 DIAGNOSIS — Z510 Encounter for antineoplastic radiation therapy: Secondary | ICD-10-CM | POA: Diagnosis not present

## 2024-01-07 LAB — RAD ONC ARIA SESSION SUMMARY
Course Elapsed Days: 46
Plan Fractions Treated to Date: 7
Plan Prescribed Dose Per Fraction: 2 Gy
Plan Total Fractions Prescribed: 15
Plan Total Prescribed Dose: 30 Gy
Reference Point Dosage Given to Date: 14 Gy
Reference Point Session Dosage Given: 2 Gy
Session Number: 32

## 2024-01-10 ENCOUNTER — Other Ambulatory Visit: Payer: Self-pay

## 2024-01-10 ENCOUNTER — Ambulatory Visit
Admission: RE | Admit: 2024-01-10 | Discharge: 2024-01-10 | Disposition: A | Discharge status: Home / Self Care | Source: Ambulatory Visit | Attending: Radiation Oncology

## 2024-01-10 DIAGNOSIS — Z510 Encounter for antineoplastic radiation therapy: Secondary | ICD-10-CM | POA: Diagnosis not present

## 2024-01-10 LAB — RAD ONC ARIA SESSION SUMMARY
Course Elapsed Days: 49
Plan Fractions Treated to Date: 8
Plan Prescribed Dose Per Fraction: 2 Gy
Plan Total Fractions Prescribed: 15
Plan Total Prescribed Dose: 30 Gy
Reference Point Dosage Given to Date: 16 Gy
Reference Point Session Dosage Given: 2 Gy
Session Number: 33

## 2024-01-11 ENCOUNTER — Ambulatory Visit
Admission: RE | Admit: 2024-01-11 | Discharge: 2024-01-11 | Discharge status: Home / Self Care | Source: Ambulatory Visit | Attending: Radiation Oncology

## 2024-01-11 ENCOUNTER — Other Ambulatory Visit: Payer: Self-pay

## 2024-01-11 DIAGNOSIS — Z510 Encounter for antineoplastic radiation therapy: Secondary | ICD-10-CM | POA: Diagnosis not present

## 2024-01-11 LAB — RAD ONC ARIA SESSION SUMMARY
Course Elapsed Days: 50
Plan Fractions Treated to Date: 9
Plan Prescribed Dose Per Fraction: 2 Gy
Plan Total Fractions Prescribed: 15
Plan Total Prescribed Dose: 30 Gy
Reference Point Dosage Given to Date: 18 Gy
Reference Point Session Dosage Given: 2 Gy
Session Number: 34

## 2024-01-12 ENCOUNTER — Other Ambulatory Visit: Payer: Self-pay

## 2024-01-12 ENCOUNTER — Ambulatory Visit
Admission: RE | Admit: 2024-01-12 | Discharge: 2024-01-12 | Disposition: A | Discharge status: Home / Self Care | Source: Ambulatory Visit | Attending: Radiation Oncology | Admitting: Radiation Oncology

## 2024-01-12 DIAGNOSIS — Z510 Encounter for antineoplastic radiation therapy: Secondary | ICD-10-CM | POA: Diagnosis not present

## 2024-01-12 LAB — RAD ONC ARIA SESSION SUMMARY
Course Elapsed Days: 51
Plan Fractions Treated to Date: 10
Plan Prescribed Dose Per Fraction: 2 Gy
Plan Total Fractions Prescribed: 15
Plan Total Prescribed Dose: 30 Gy
Reference Point Dosage Given to Date: 20 Gy
Reference Point Session Dosage Given: 2 Gy
Session Number: 35

## 2024-01-13 ENCOUNTER — Ambulatory Visit
Admission: RE | Admit: 2024-01-13 | Discharge: 2024-01-13 | Disposition: A | Discharge status: Home / Self Care | Source: Ambulatory Visit | Attending: Radiation Oncology

## 2024-01-13 ENCOUNTER — Ambulatory Visit

## 2024-01-13 ENCOUNTER — Other Ambulatory Visit: Payer: Self-pay

## 2024-01-13 ENCOUNTER — Ambulatory Visit
Admission: RE | Admit: 2024-01-13 | Discharge: 2024-01-13 | Disposition: A | Discharge status: Home / Self Care | Source: Ambulatory Visit | Attending: Radiation Oncology | Admitting: Radiation Oncology

## 2024-01-13 DIAGNOSIS — Z510 Encounter for antineoplastic radiation therapy: Secondary | ICD-10-CM | POA: Diagnosis not present

## 2024-01-13 LAB — RAD ONC ARIA SESSION SUMMARY
Course Elapsed Days: 52
Plan Fractions Treated to Date: 11
Plan Prescribed Dose Per Fraction: 2 Gy
Plan Total Fractions Prescribed: 15
Plan Total Prescribed Dose: 30 Gy
Reference Point Dosage Given to Date: 22 Gy
Reference Point Session Dosage Given: 2 Gy
Session Number: 36

## 2024-01-14 ENCOUNTER — Other Ambulatory Visit: Payer: Self-pay

## 2024-01-14 ENCOUNTER — Ambulatory Visit
Admission: RE | Admit: 2024-01-14 | Discharge: 2024-01-14 | Disposition: A | Discharge status: Home / Self Care | Source: Ambulatory Visit | Attending: Radiation Oncology | Admitting: Radiation Oncology

## 2024-01-14 ENCOUNTER — Ambulatory Visit

## 2024-01-14 DIAGNOSIS — Z510 Encounter for antineoplastic radiation therapy: Secondary | ICD-10-CM | POA: Diagnosis not present

## 2024-01-14 LAB — RAD ONC ARIA SESSION SUMMARY
Course Elapsed Days: 53
Plan Fractions Treated to Date: 12
Plan Prescribed Dose Per Fraction: 2 Gy
Plan Total Fractions Prescribed: 15
Plan Total Prescribed Dose: 30 Gy
Reference Point Dosage Given to Date: 24 Gy
Reference Point Session Dosage Given: 2 Gy
Session Number: 37

## 2024-01-17 ENCOUNTER — Other Ambulatory Visit: Payer: Self-pay

## 2024-01-17 ENCOUNTER — Ambulatory Visit

## 2024-01-17 ENCOUNTER — Ambulatory Visit
Admission: RE | Admit: 2024-01-17 | Discharge: 2024-01-17 | Disposition: A | Discharge status: Home / Self Care | Source: Ambulatory Visit | Attending: Radiation Oncology | Admitting: Radiation Oncology

## 2024-01-17 DIAGNOSIS — Z510 Encounter for antineoplastic radiation therapy: Secondary | ICD-10-CM | POA: Diagnosis not present

## 2024-01-17 LAB — RAD ONC ARIA SESSION SUMMARY
Course Elapsed Days: 56
Plan Fractions Treated to Date: 13
Plan Prescribed Dose Per Fraction: 2 Gy
Plan Total Fractions Prescribed: 15
Plan Total Prescribed Dose: 30 Gy
Reference Point Dosage Given to Date: 26 Gy
Reference Point Session Dosage Given: 2 Gy
Session Number: 38

## 2024-01-18 ENCOUNTER — Ambulatory Visit

## 2024-01-18 ENCOUNTER — Ambulatory Visit
Admission: RE | Admit: 2024-01-18 | Discharge: 2024-01-18 | Disposition: A | Discharge status: Home / Self Care | Source: Ambulatory Visit | Attending: Radiation Oncology | Admitting: Radiation Oncology

## 2024-01-18 ENCOUNTER — Other Ambulatory Visit: Payer: Self-pay

## 2024-01-18 DIAGNOSIS — Z510 Encounter for antineoplastic radiation therapy: Secondary | ICD-10-CM | POA: Diagnosis not present

## 2024-01-18 LAB — RAD ONC ARIA SESSION SUMMARY
Course Elapsed Days: 57
Plan Fractions Treated to Date: 14
Plan Prescribed Dose Per Fraction: 2 Gy
Plan Total Fractions Prescribed: 15
Plan Total Prescribed Dose: 30 Gy
Reference Point Dosage Given to Date: 28 Gy
Reference Point Session Dosage Given: 2 Gy
Session Number: 39

## 2024-01-19 ENCOUNTER — Ambulatory Visit

## 2024-01-19 ENCOUNTER — Ambulatory Visit
Admission: RE | Admit: 2024-01-19 | Discharge: 2024-01-19 | Disposition: A | Discharge status: Home / Self Care | Source: Ambulatory Visit | Attending: Radiation Oncology | Admitting: Radiation Oncology

## 2024-01-19 ENCOUNTER — Other Ambulatory Visit: Payer: Self-pay

## 2024-01-19 DIAGNOSIS — Z510 Encounter for antineoplastic radiation therapy: Secondary | ICD-10-CM | POA: Diagnosis not present

## 2024-01-19 DIAGNOSIS — C61 Malignant neoplasm of prostate: Secondary | ICD-10-CM

## 2024-01-19 LAB — RAD ONC ARIA SESSION SUMMARY
Course Elapsed Days: 58
Plan Fractions Treated to Date: 15
Plan Prescribed Dose Per Fraction: 2 Gy
Plan Total Fractions Prescribed: 15
Plan Total Prescribed Dose: 30 Gy
Reference Point Dosage Given to Date: 30 Gy
Reference Point Session Dosage Given: 2 Gy
Session Number: 40

## 2024-01-20 NOTE — Radiation Completion Notes (Addendum)
  Radiation Oncology         [phone] ________________________________  Name: Daniel Norris MRN: [id number]  Date: 01/19/2024  DOB: 1956-05-14  Referring Physician: Sherwood Edison, M.D. Date of Service: 2024-01-20 Radiation Oncologist: Adina Barge, M.D. [facility]     RADIATION ONCOLOGY END OF TREATMENT NOTE     Diagnosis: 68 y.o. 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   Intent: Curative     ==========DELIVERED PLANS==========  First Treatment Date: 2023-11-22 Last Treatment Date: 2024-01-19   Plan Name: Prostate_Pelv (including pelvic nodes and bony metastases) Site: Prostate Technique: IMRT Mode: Photon Dose Per Fraction: 1.8 Gy Prescribed Dose (Delivered / Prescribed): 45 Gy / 45 Gy Prescribed Fxs (Delivered / Prescribed): 25 / 25   Plan Name: Prostate/PET positive pelvic nodes/bony metastases_Bst Site: Prostate Technique: IMRT Mode: Photon Dose Per Fraction: 2 Gy Prescribed Dose (Delivered / Prescribed): 30 Gy / 30 Gy Prescribed Fxs (Delivered / Prescribed): 15 / 15     ==========ON TREATMENT VISIT DATES========== 2023-11-26, 2023-12-03, 2023-12-10, 2023-12-17, 2023-12-23, 2023-12-31, 2024-01-07, 2024-01-13     See weekly On Treatment Notes in Epic for details in the Media tab (listed as Progress notes on the On Treatment Visit Dates listed above).  The patient will receive a call in about one month from the radiation oncology department. He will continue follow up with his medical oncologist, Dr. Edison, as well.  ------------------------------------------------   Donnice Barge, MD [facility]  Radiation Oncology Direct Dial: [phone]  Fax: [phone] [email or web address]  Skype  LinkedIn

## 2024-01-25 NOTE — Progress Notes (Signed)
 Patient was a RadOnc Consult on 11/04/23 for his stage IV, oligometastatic adenocarcinoma of the prostate with Gleason score of 4+5, and PSA of 90.6.  Patient proceed with treatment recommendations of 8-week course of daily external radiation concurrent with LT-ADT  and had his final radiation treatment on 01/19/24.   Patient is scheduled for a post treatment nurse call on 02/15/24 and has his first post treatment PSA on 03/13/24 at [name].

## 2024-02-08 ENCOUNTER — Other Ambulatory Visit: Payer: Self-pay | Admitting: Urology

## 2024-02-08 DIAGNOSIS — C61 Malignant neoplasm of prostate: Secondary | ICD-10-CM

## 2024-02-15 ENCOUNTER — Ambulatory Visit
Admission: RE | Admit: 2024-02-15 | Discharge: 2024-02-15 | Disposition: A | Discharge status: Home / Self Care | Source: Ambulatory Visit | Attending: Radiation Oncology | Admitting: Radiation Oncology

## 2024-02-15 DIAGNOSIS — C61 Malignant neoplasm of prostate: Secondary | ICD-10-CM | POA: Insufficient documentation

## 2024-02-15 NOTE — Progress Notes (Signed)
  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   Pre Treatment IPSS Score: 4  The patient was available for call today.   Symptoms of fatigue have not improved since completing therapy.  Reports increased tiredness, jittery felling and at times SOB.  Reports he feels its related to medication Erleada and has been taking 2 tablets instead of the 4 tablets ordered. This Clinical research associate advised [name] call his urologist about these side effects ASAP.  As this is not related to radiation treatment.  If unable to get someone to get to ED if SOB worsens and if having chest pain.  Symptoms of bladder changes have improved since completing therapy. Current symptoms include urinary frequency, mild dysuria, and nocturia x 4 medications for bladder symptoms include Tamsulosin .  Symptoms of bowel changes have improved since completing therapy. No current symptoms and no medications for bowel symptoms needed at this time.  Having regular solid stools.  Post Treatment IPSS Score: IPSS Questionnaire (AUA-7): Over the past month.   1)  How often have you had a sensation of not emptying your bladder completely after you finish urinating?  0 - Not at all  2)  How often have you had to urinate again less than two hours after you finished urinating? 0 - Not at all  3)  How often have you found you stopped and started again several times when you urinated?  0 - Not at all  4) How difficult have you found it to postpone urination?  0 - Not at all  5) How often have you had a weak urinary stream?  0 - Not at all  6) How often have you had to push or strain to begin urination?  0 - Not at all  7) How many times did you most typically get up to urinate from the time  you went to bed until the time you got up in the morning?  4 - 4 times  Total score:  4. Which indicates mild symptoms  0-7 mildly symptomatic   8-19 moderately symptomatic   20-35 severely symptomatic   Patient does not currently have any scheduled follow up with his urologist to his knowledge so he was advised to call [name] to schedule his post-treatment follow up with [name] for ongoing side effects from the Erleada medication. He was counseled that PSA levels will be drawn in the urology office, and was reassured that additional time is expected to improve bowel and bladder symptoms. He was encouraged to call back with concerns or questions regarding radiation.

## 2024-03-01 ENCOUNTER — Other Ambulatory Visit: Payer: Self-pay | Admitting: Internal Medicine

## 2024-03-01 DIAGNOSIS — Z122 Encounter for screening for malignant neoplasm of respiratory organs: Secondary | ICD-10-CM

## 2024-03-02 ENCOUNTER — Encounter: Payer: Self-pay | Admitting: Internal Medicine

## 2024-03-06 ENCOUNTER — Encounter: Payer: Self-pay | Admitting: Family Medicine

## 2024-03-07 ENCOUNTER — Inpatient Hospital Stay
Admission: RE | Admit: 2024-03-07 | Discharge: 2024-03-07 | Disposition: A | Discharge status: Home / Self Care | Source: Ambulatory Visit | Attending: Internal Medicine | Admitting: Internal Medicine

## 2024-03-07 DIAGNOSIS — Z122 Encounter for screening for malignant neoplasm of respiratory organs: Secondary | ICD-10-CM

## 2024-03-17 ENCOUNTER — Encounter: Payer: Self-pay | Admitting: *Deleted

## 2024-04-25 ENCOUNTER — Encounter: Payer: Self-pay | Admitting: *Deleted

## 2024-04-27 ENCOUNTER — Encounter: Payer: Self-pay | Admitting: *Deleted

## 2024-05-01 ENCOUNTER — Inpatient Hospital Stay: Attending: Internal Medicine | Admitting: *Deleted

## 2024-05-01 ENCOUNTER — Encounter: Payer: Self-pay | Admitting: *Deleted

## 2024-05-01 DIAGNOSIS — C61 Malignant neoplasm of prostate: Secondary | ICD-10-CM

## 2024-05-01 NOTE — Progress Notes (Signed)
SCP reviewed and completed.
